# Patient Record
Sex: Female | Born: 1980 | Race: Black or African American | Hispanic: No | Marital: Single | Smoking: Never smoker
Health system: Southern US, Community
[De-identification: ages and names within clinical notes are randomized; demographics above are authoritative.]

## PROBLEM LIST (undated history)

## (undated) DIAGNOSIS — J45909 Unspecified asthma, uncomplicated: Secondary | ICD-10-CM

## (undated) HISTORY — PX: WISDOM TOOTH EXTRACTION: SHX21

---
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
## 2001-09-25 ENCOUNTER — Encounter: Payer: Self-pay | Admitting: Emergency Medicine

## 2001-09-25 ENCOUNTER — Emergency Department (HOSPITAL_COMMUNITY): Admission: EM | Admit: 2001-09-25 | Discharge: 2001-09-25 | Payer: Self-pay | Admitting: Emergency Medicine

## 2004-09-09 ENCOUNTER — Emergency Department (HOSPITAL_COMMUNITY): Admission: EM | Admit: 2004-09-09 | Discharge: 2004-09-09 | Payer: Self-pay | Admitting: Emergency Medicine

## 2004-09-20 ENCOUNTER — Emergency Department (HOSPITAL_COMMUNITY): Admission: EM | Admit: 2004-09-20 | Discharge: 2004-09-20 | Payer: Self-pay | Admitting: Emergency Medicine

## 2005-05-05 ENCOUNTER — Emergency Department (HOSPITAL_COMMUNITY): Admission: EM | Admit: 2005-05-05 | Discharge: 2005-05-05 | Payer: Self-pay | Admitting: Emergency Medicine

## 2005-06-01 ENCOUNTER — Emergency Department (HOSPITAL_COMMUNITY): Admission: EM | Admit: 2005-06-01 | Discharge: 2005-06-01 | Payer: Self-pay | Admitting: Emergency Medicine

## 2005-09-16 ENCOUNTER — Ambulatory Visit: Payer: Self-pay | Admitting: Pulmonary Disease

## 2006-01-06 ENCOUNTER — Ambulatory Visit: Payer: Self-pay | Admitting: Pulmonary Disease

## 2006-02-10 ENCOUNTER — Ambulatory Visit: Payer: Self-pay | Admitting: Pulmonary Disease

## 2006-03-10 ENCOUNTER — Ambulatory Visit: Payer: Self-pay | Admitting: Internal Medicine

## 2006-04-18 ENCOUNTER — Emergency Department (HOSPITAL_COMMUNITY): Admission: EM | Admit: 2006-04-18 | Discharge: 2006-04-18 | Payer: Self-pay | Admitting: Emergency Medicine

## 2006-05-04 ENCOUNTER — Inpatient Hospital Stay (HOSPITAL_COMMUNITY): Admission: EM | Admit: 2006-05-04 | Discharge: 2006-05-06 | Payer: Self-pay | Admitting: Emergency Medicine

## 2006-05-04 ENCOUNTER — Ambulatory Visit: Payer: Self-pay | Admitting: Internal Medicine

## 2006-06-19 ENCOUNTER — Emergency Department (HOSPITAL_COMMUNITY): Admission: EM | Admit: 2006-06-19 | Discharge: 2006-06-19 | Payer: Self-pay | Admitting: Emergency Medicine

## 2006-09-08 ENCOUNTER — Ambulatory Visit: Payer: Self-pay | Admitting: Pulmonary Disease

## 2006-10-28 ENCOUNTER — Ambulatory Visit: Payer: Self-pay | Admitting: Internal Medicine

## 2007-01-08 ENCOUNTER — Emergency Department (HOSPITAL_COMMUNITY): Admission: EM | Admit: 2007-01-08 | Discharge: 2007-01-08 | Payer: Self-pay | Admitting: Emergency Medicine

## 2007-01-09 ENCOUNTER — Emergency Department (HOSPITAL_COMMUNITY): Admission: EM | Admit: 2007-01-09 | Discharge: 2007-01-09 | Payer: Self-pay | Admitting: Emergency Medicine

## 2007-02-20 IMAGING — CR DG CHEST 1V PORT
1 series · 1 of 1 positions shown · non-contrast
Comparison: none

CLINICAL DATA: 24-year-old female with asthma.  
 PORTABLE CHEST ? 1 VIEW:

[view not recorded]
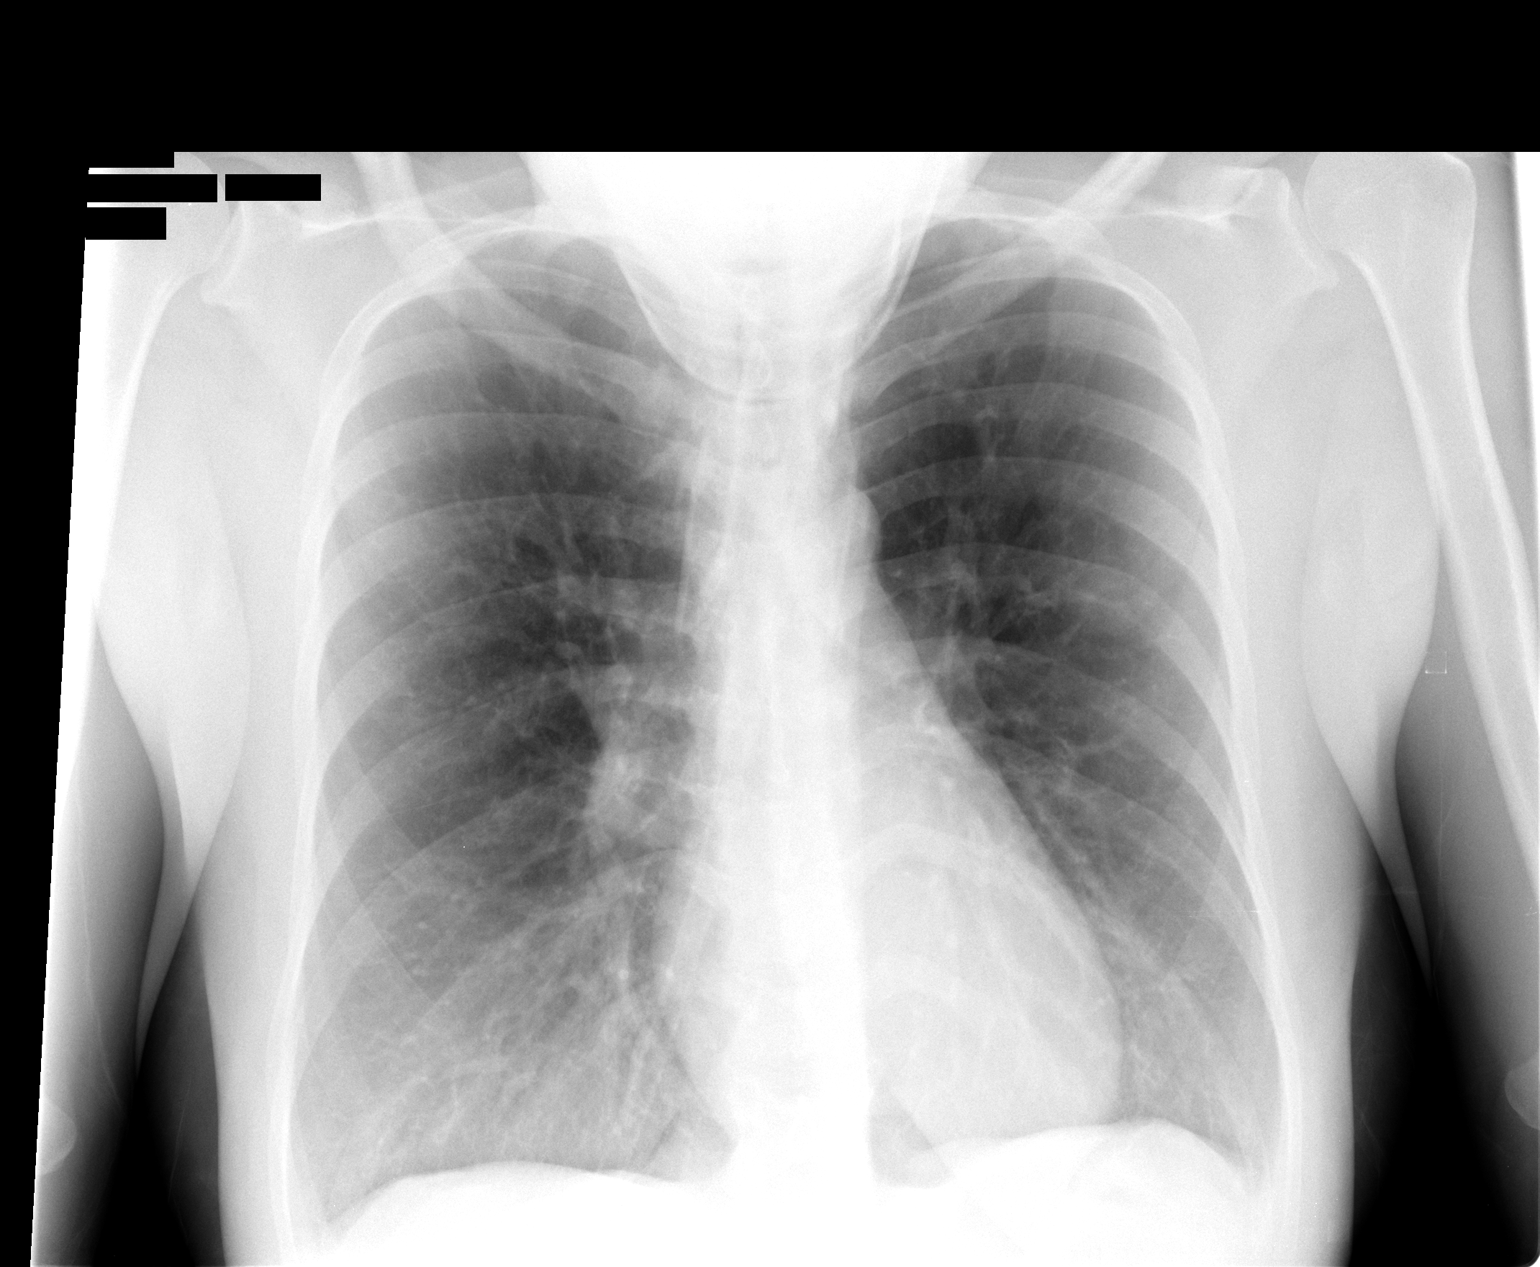

[1 of 1 positions shown; findings below may reference images not displayed]

FINDINGS: The lungs are hyperinflated.  No focal airspace disease is evident.  There is mild increased prominence of interstitial markings.  Visualized soft tissues and bony thorax are unremarkable.
IMPRESSION: 1.  Hyperinflation compatible with reactive airways disease.  
 2.  Mild interstitial prominence without focal airspace disease.

## 2007-04-17 ENCOUNTER — Ambulatory Visit: Payer: Self-pay | Admitting: Pulmonary Disease

## 2007-06-27 ENCOUNTER — Ambulatory Visit: Payer: Self-pay | Admitting: Internal Medicine

## 2007-11-10 ENCOUNTER — Encounter: Payer: Self-pay | Admitting: Pulmonary Disease

## 2008-05-06 ENCOUNTER — Inpatient Hospital Stay (HOSPITAL_COMMUNITY): Admission: EM | Admit: 2008-05-06 | Discharge: 2008-05-07 | Payer: Self-pay | Admitting: Emergency Medicine

## 2008-09-16 ENCOUNTER — Emergency Department (HOSPITAL_COMMUNITY): Admission: EM | Admit: 2008-09-16 | Discharge: 2008-09-17 | Payer: Self-pay | Admitting: Emergency Medicine

## 2008-10-03 ENCOUNTER — Emergency Department (HOSPITAL_COMMUNITY): Admission: EM | Admit: 2008-10-03 | Discharge: 2008-10-03 | Payer: Self-pay | Admitting: Emergency Medicine

## 2009-02-22 IMAGING — CR DG CHEST 2V
2 series · 2 of 2 positions shown · non-contrast
Comparison: 05/04/2006

CLINICAL DATA: Short of breath.  Asthma.

CHEST - 2 VIEW

[w chest pa *]
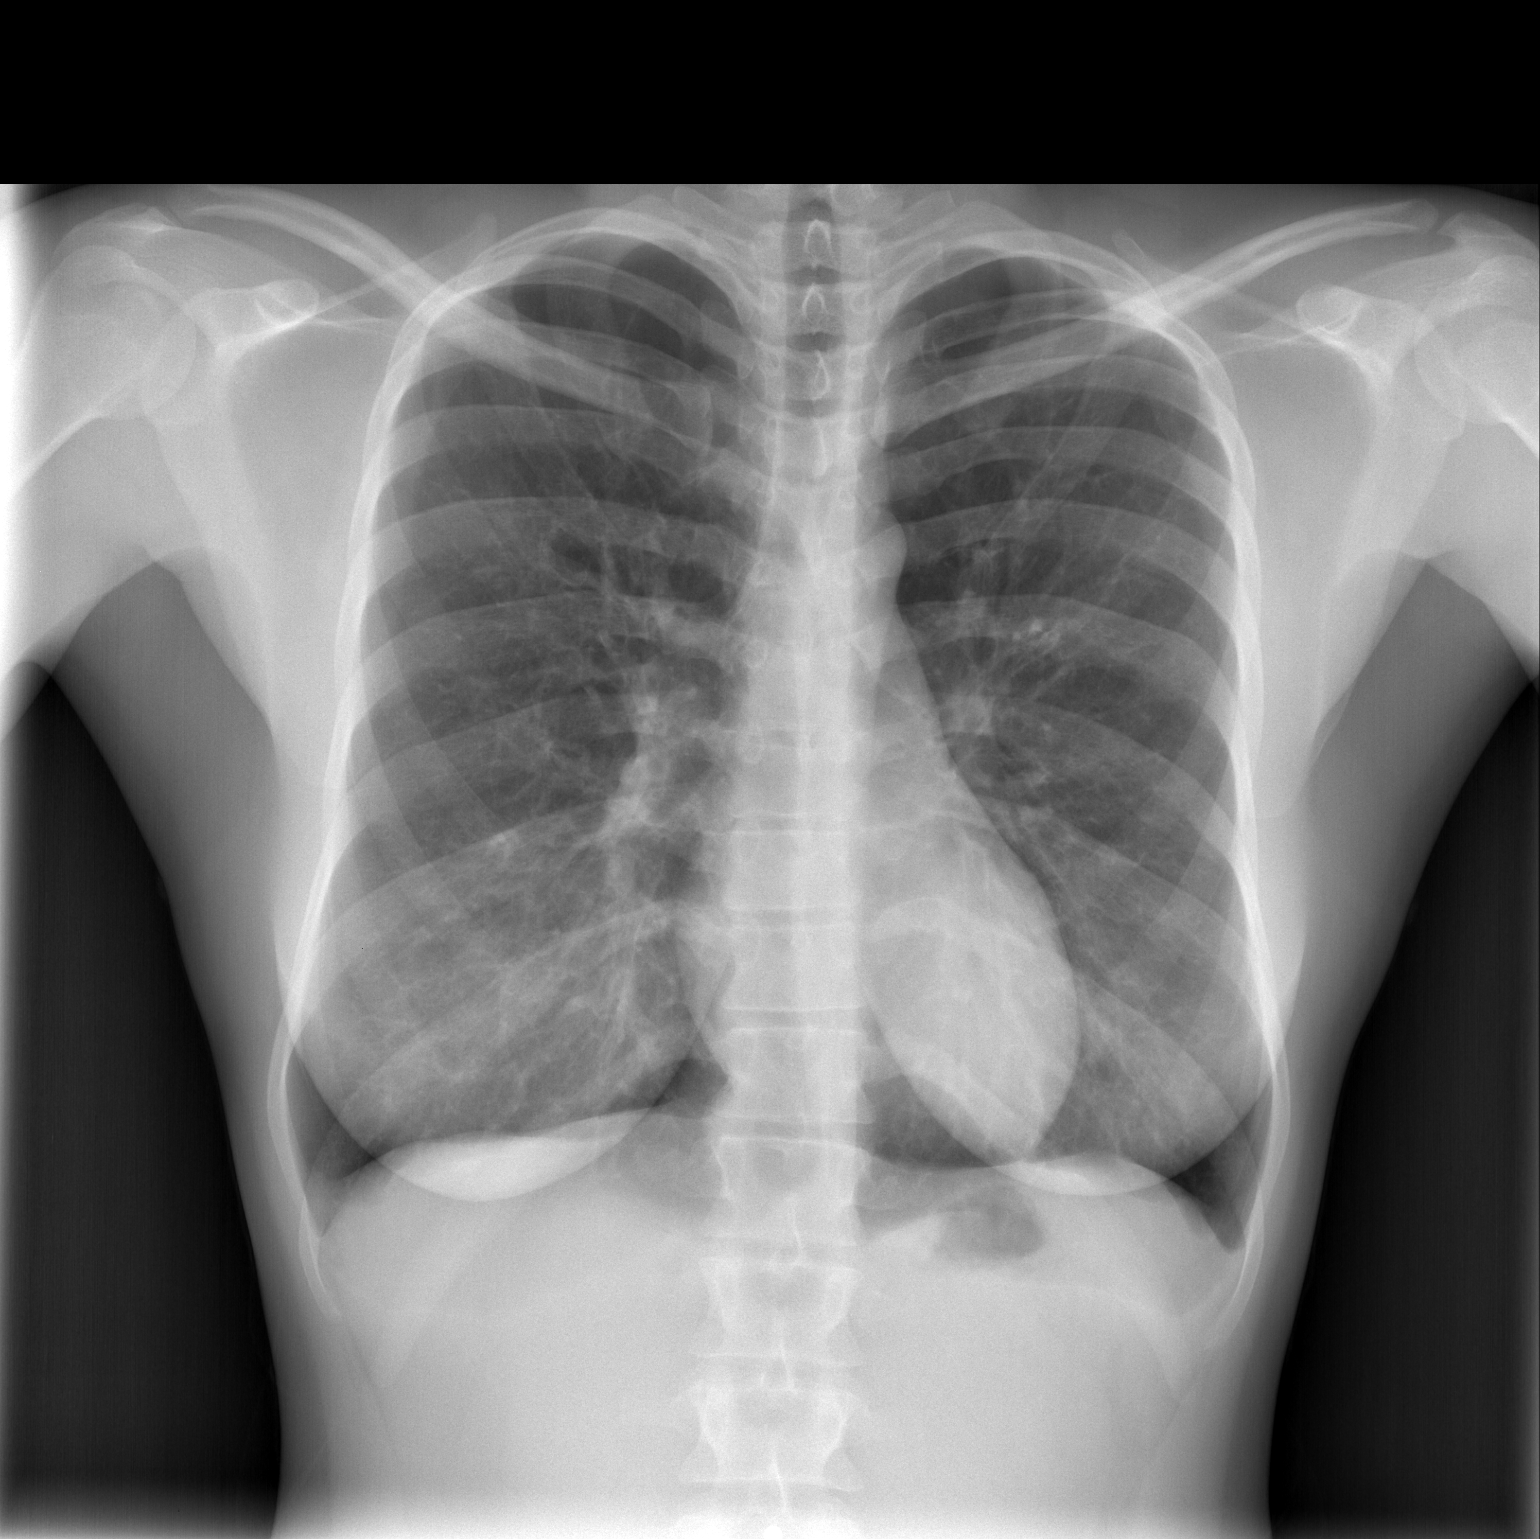

[w chest lat *]
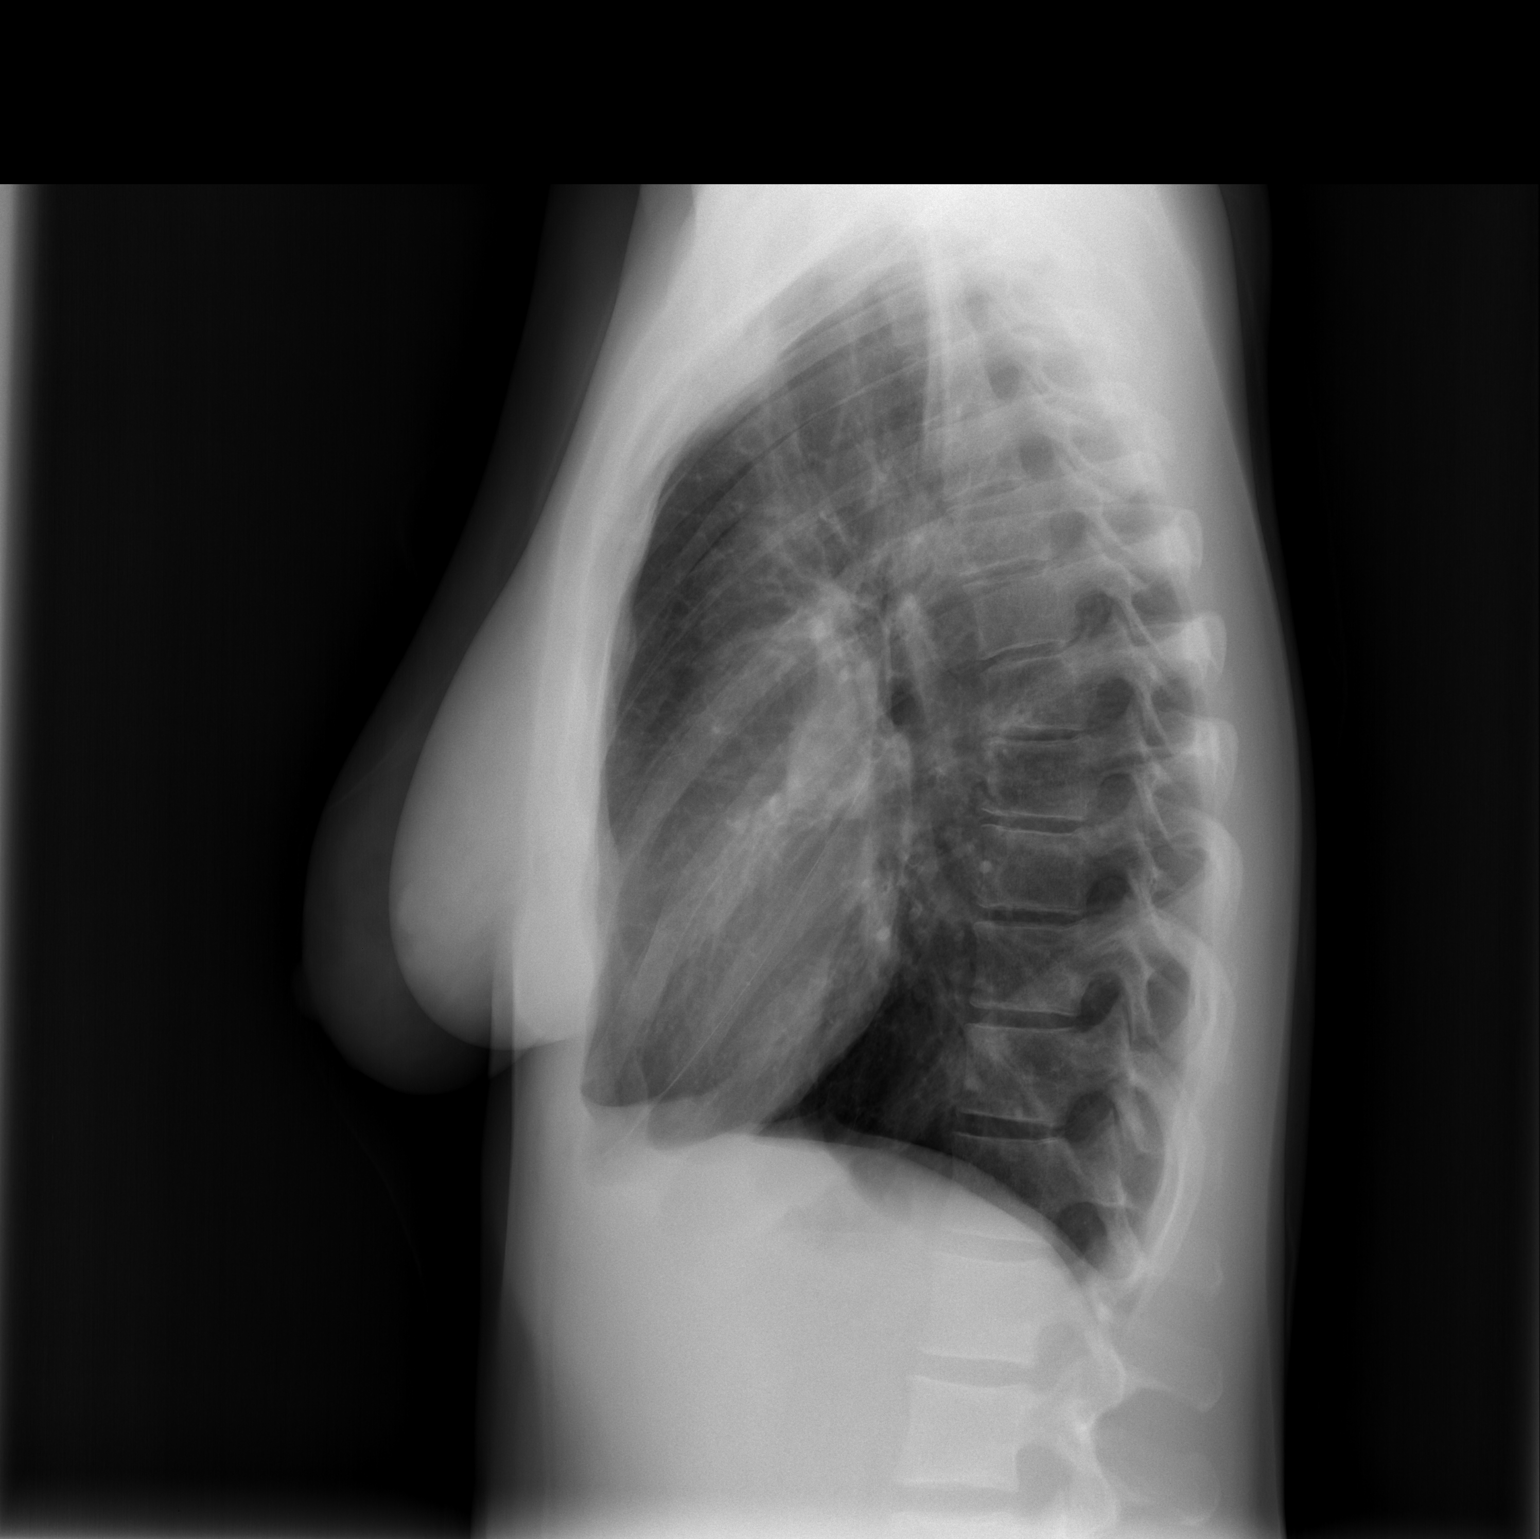

[2 of 2 positions shown; findings below may reference images not displayed]

FINDINGS: Lung volume is normal.  Lung markings are mildly
prominent compatible with asthma.  There is no acute infiltrate or
effusion.  The lungs are clear.
IMPRESSION: Chronic lung disease.  No acute abnormality.

## 2011-05-04 NOTE — Assessment & Plan Note (Signed)
[facility]                             PULMONARY OFFICE NOTE   [name], [name]                   MRN:          [id number]  DATE:06/27/2007                            DOB:          20-Jun-1981    HISTORY OF PRESENT ILLNESS:  The patient is a 30 year old African-  American female patient of Dr. Sung Amabile who has a history of moderate  persistent asthma who presents today for an acute office visit. The  patient complains that over the last week she has had increased dry  coughs, wheezing, and shortness of breath. The patient has ran out of  her Symbicort shortly after symptoms began. The patient denies any  fever, hemoptysis, orthopnea, PND, or leg swelling. The patient is  supposed to be maintained on Symbicort 160/4.5 2 puffs twice daily and  Singulair 10 mg daily.   PAST MEDICAL HISTORY:  Reviewed.   CURRENT MEDICATIONS:  Reviewed.   PHYSICAL EXAMINATION:  GENERAL:  The patient is a pleasant female in no  acute distress.  VITAL SIGNS:  She is afebrile with stable vital signs. O2 saturation is  96% on room air.  HEENT:  Unremarkable.  NECK:  Supple without cervical adenopathy. No JVD.  LUNGS:  Reveal expiratory wheezes bilaterally.  CARDIAC:  Regular rate.  ABDOMEN:  Soft and nontender.  EXTREMITIES:  Warm without calf tenderness, cyanosis, clubbing, or  edema.   IMPRESSION AND PLAN:  Acute asthmatic exacerbation secondary to  discontinuation of medications. The patient is recommended to restart  Symbicort 160/4.5 2 puffs twice daily and Singulair daily. The patient  was given a Xopenex nebulizer treatment in the office with improved  aeration and will begin a short prednisone taper over the next week. She  will follow back up here in the office in 1-2 weeks with Dr. Sung Amabile or  sooner if needed. The patient is also to reschedule her appointment with  our allergist, Dr. Maple Hudson, as recommended. The patient had previously  missed an  appointment with him last month.      Rubye Oaks, NP  Electronically Signed      Oley Balm. Sung Amabile, MD  Electronically Signed   TP/MedQ  DD: 06/30/2007  DT: 07/01/2007  Job #: [id number]

## 2011-05-04 NOTE — H&P (Signed)
[name], [name]          ACCOUNT NO.:  [id number]   MEDICAL RECORD NO.:  [id number]          PATIENT TYPE:  EMS   LOCATION:  ED                           FACILITY[facility]  [facility]   PHYSICIAN:  Michelene Gardener, MD    DATE OF BIRTH:  1981/09/18   DATE OF ADMISSION:  05/06/2008  DATE OF DISCHARGE:                              HISTORY & PHYSICAL   PRIMARY CARE PHYSICIAN:  The pt normally follows at [facility]   CHIEF COMPLAINT:  Increasing shortness of breath and wheezes.   HISTORY OF PRESENT ILLNESS:  This is a 30 year old African American  female with past medical history of asthma presented with the above-  mentioned complaint.  This patient had allergies to egg and does not  take eggs.  She went to [employer] last night and she was given egg by  mistake in her sandwich.  Since that time, she has been having  increasing tightness with wheezes and shortness of breath.  She has also  been having dry cough.  She used her albuterol and Symbicort at home  without much relief.  So she came to the hospital for further  evaluation.  In the hospital, she was given a couple of nebulizer  treatments and she was given Solu-Medrol.  She is still very tight and  wheezy, and the hospitalist service was called for further evaluation.   PAST MEDICAL HISTORY:  Significant for asthma.   PAST SURGICAL HISTORY:  Denied.   ALLERGIES:  BENADRYL, PENICILLIN, EGG.   CURRENT MEDICATIONS:  1. Symbicort two puffs twice daily.  2. Albuterol 2 puffs q.4 h as needed.   SOCIAL HISTORY:  She denies smoking.  She drinks alcohol occasionally.  She denied recreational drugs.   FAMILY HISTORY:  Her mother had history of asthma, diabetes run in both  sides of the family.   REVIEW OF SYSTEMS:  CONSTITUTIONAL:  Positive for fatigability.  Eyes:  No blurred vision.  ENT:  No tinnitus.  RESPIRATORY:  Positive for dry  cough, wheeze and shortness of breath.  GI:  No nausea, vomiting or  diarrhea.   GU:  No dysuria or hematuria.  ENDOCRINE:  No polyuria, no  nocturia, no bruises, no bleeding.  ID:  No rash, no lesions.  NEUROLOGICAL:  No numbness or tingling.  The rest of systems reviewed  and are negative.   PHYSICAL EXAMINATION:  VITAL SIGNS: Temperature is 98.4.  Blood pressure  initially was 99/71 and repeat was that 129/64, pulse oximetry 78,  respiratory rate 22.  GENERAL APPEARANCE:  This is a young [place] American female in no acute  distress.  HEENT:  Conjunctivae are pink.  Pupils are equal and react to light.  There is no ptosis.  Hearing is intact.  There is no ear discharge or  infection.  There is no oral infection or bleeding.  Oral mucosa is dry.  Oropharyngeal no erythema.  NECK:  Supple.  No JVD, no carotid bruit, no lymphadenopathy.  No  thyroid enlargement or thyroid tenderness.  CARDIOVASCULAR:  S1, S2 regular.  There are no murmurs, gallops noticed.  RESPIRATORY EXAMINATION:  Breathing between 20-22.  There is no use of  accessory muscles.  There is positive bilateral expiratory wheezes.  There are no rales.  There is no rhonchi.  ABDOMEN:  Soft, nondistended, no tenderness.  No hepatosplenomegaly.  Bowel sounds are present.  LOWER EXTREMITIES:  No edema.  No rash.  No varicose veins.  NEUROLOGICAL:  Cranial nerves II-XII are intact.  There is no motor or  sensory deficits.   LABORATORY DATA:  Sodium 140, potassium 4.7, chloride 107, bicarb 23 and  BUN 8, creatinine 1.1.   STUDIES:  Chest x-ray showing showed chronic lung disease without acute  abnormalities.   IMPRESSION:  1. Acute exacerbation of asthma.  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  3. Polycythemia.   PLAN:  This patient has history of asthma.  She has never been  intubated.  She does not use home oxygen and she is not on steroids.  Her asthma exacerbated because she took egg which she is allergic to.  She improved some to nebulizer treatment, but she is still very tight  and has a lot of  wheezes bilaterally.  I will admit her to the hospital  for 24-hour observation.  I will start her on Solu-Medrol 80 mg IV q.8  h.  I will put her on nebulizer treatment q.6 h and then q.2 h as  needed.  Currently, saturating well, but we will watch her oxygenation  very carefully and if she starts to desaturate, then will start her on  oxygen.  No need for antibiotics at this time.   ASSESSMENT TIME:  Fifty minutes.      Michelene Gardener, MD  Electronically Signed     NAE/MEDQ  D:  05/06/2008  T:  05/06/2008  Job:  [id number]

## 2011-05-07 NOTE — Letter (Signed)
July 21, 2006     [facility]  [address]  [place], [place] [address]   RE:  [name], [name]  MRN:  [id number]  /  DOB:  09-06-1981   Dear Ms. Bielby:   My records indicate that you have failed to return for follow-up office  visit as requested on March 10, 2006, and were seen in the emergency room  unofficially for an asthma attack on June 19, 2006, and advised that you  would need to reestablish with my office.  My understanding is that you have  now made several appointments with Dr. Jayme Cloud but were not able to keep  them.  I was advised today that you do, indeed, have an appointment to see  Dr. Jayme Cloud within the next three weeks.  If you are not able to make this  appointment, I would like you to call my office and explain why.  Otherwise,  if you fail to keep this appointment, I will be forced to discharge you from  our medical practice to release [place] from responsibility for your care.   I want to take this opportunity to remind you that I advised both you and  your mother in the ER that you would need much more intense outpatient  follow-up by our office to prevent you from further complications of your  asthma including death.  I hoped that you would take this issues more  seriously than you have to date and this later will serve as my final  warning that we will be forced to  discharge you from our practice if you do not keep your appointments in this  clinic with one of our pulmonary specialists within the next three weeks.   Sincerely,     Casimiro Needle B. Sherene Sires, MD, [facility]   MBW/MedQ  DD:  07/21/2006  DT:  07/21/2006  Job #:  [id number]

## 2011-05-07 NOTE — Assessment & Plan Note (Signed)
[facility]                               PULMONARY OFFICE NOTE   SHAMAINE, MULKERN                   MRN:          [id number]  DATE:09/08/2006                            DOB:          Sep 18, 1981    Ms. Peyser is a 30 year old African-American female who previously saw  Dr. Wandra Scot and had issues with moderate persistent asthma and poor  medical compliance. Subsequently she was transferred to the care of Dr.  Sandrea Hughs, and he saw her in March of 2007. At that time, it was known  that the patient had difficulties with difficult to control asthma, most  likely due to chronic remodeling due to the patients non-adherence to  medical regimen. The patient subsequently apparently stated that she wanted  to switch her care to me, and apparently this was done without my approval  of the same.   Today, she presents with what apparently was a followup visit but really is  more of an acute care extended visit given the fact that she is having  active symptoms. The patient states that she has had difficulties with  increasing dyspnea over the last 3-4 days prior to this visit.  She has had  some nasal congestion with clear nasal discharge.  No fevers, chills or  sweats.   CURRENT MEDICATIONS:  She claims she is on Singulair 10 mg daily and Advair  250/51 inhalation twice a day.  In addition, she uses albuterol as a rescue  inhaler. However, she appears to be out of the majority of these  medications, she states due to insurance issues.   PHYSICAL EXAM:  VITAL SIGNS: Blood pressure 100/70, pulse 80, oxygen  saturation of 97%, temperature of 98 and a weight of 143 pounds.  GENERAL: This is a well-developed, well-nourished African-American female  who appears to be in no acute respiratory distress.  HEENT: Reveals turbinate edema with clear discharge from the nares.  She has  moderate postnasal drip noted. Pharynx is otherwise clear.  Ears  show  bilateral serous otitis.  NECK: Supple.  No adenopathy noted.  No JVD.  LUNGS: She has scattered wheezes throughout, but air movement remains  relatively intact.  CARDIAC: Regular rate and rhythm.  No rubs, murmurs or gallops heard.  EXTREMITIES: The patient has no cyanosis, no clubbing, no edema noted.   IMPRESSION:  Acute exacerbation of asthma likely triggered by allergic  rhinitis and possibly also due to medical non-adherence.   PLAN:  1. We did give the patient samples of Singular 10 mg, provided her also      with a prescription for the same.  2. We will switch her from Advair to Symbicort 164.5, two inhalations      twice a day. Patient was instructed proper way of using this.  3. Patient will also adhere to a nasal hygiene with Nasonex, two      inhalations daily to each nostril and nasal saline.  4. We will obtain an IgM RAST today.  5. Followup will be with our nurse practitioner in two weeks' time.  She  is contact us prior to that time should any new problems arise. I will      see her one more time after that; however,      she has been informed if she continues to have difficulties with non-      adherence and lack of followup, she will be discharged from the      practice.                                   Gailen Shelter, MD   CLG/MedQ  DD:  09/09/2006  DT:  09/12/2006  Job #:  [id number]   cc:   Charlaine Dalton. Sherene Sires, MD, FCCP

## 2011-05-07 NOTE — Discharge Summary (Signed)
[name], [name]          ACCOUNT NO.:  [id number]   MEDICAL RECORD NO.:  [id number]          PATIENT TYPE:  INP   LOCATION:  4729                         FACILITY:  [facility]   PHYSICIAN:  Casimiro Needle B. Sherene Sires, M.D. [facility] OF BIRTH:  [birth year]   DATE OF ADMISSION:  05/04/2006  DATE OF DISCHARGE:  05/06/2006                                 DISCHARGE SUMMARY   FINAL DIAGNOSES:  1.  Status asthmaticus with acute respiratory distress and hypoxemia,      resolved.  2.  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   HISTORY:  This is a 30 year old black female who has seen both Dr. Sung Amabile  and myself with emphasis on each visit to maintaining controlling  medications and minimizing the use of albuterol.  She was supposed to be  maintained on Advair 250/50 b.i.d. and Singulair 10 mg daily and states she  had no symptoms while on it but within a week of running out of her Advair  (not ever clear exactly why she stopped it other than she ran out), she  began having trouble with her allergies, which consisted of nasal and  chest congestion with a hacking cough productive of minimal, thick mucus  and increasing need for albuterol, both in MDI and nebulizer form.  She came  to the emergency room in respiratory extremis with hypercarbic and hypoxemic  respiratory failure with threatened intubation.  She responded, however, to  magnesium sulfate, IV steroids, and albuterol in the emergency room and was  admitted, ultimately, to the floor.  She gradually improved to the point  where she did not need any form of rescue therapy for 12 hours prior to  discharge.  At the time of discharge, she did have a few end-expiratory  wheezes but was comfortable at rest on room air.   Each day I saw this patient, I spent extra time trying to trouble-shoot the  hurdles that are preventing her from getting adequate care.  I have  offered free samples and the  services of our nurse practitioner for work-in  visits, in the event that she either runs out of her medicines or begins  noticing increased need for albuterol over her baseline (which is typically  less than twice weekly).  I told her that anytime she needs to use her  nebulizer as a back-up, it means she needs to pick up the phone and call  us and if the nebulizer does not completely eradicate all of her symptoms,  she needs to go to the emergency room immediately and emphasized to her that  she cannot get to the root of the problem by simply covering up the  symptoms with albuterol.   At the time of discharge, she was improved.  I was still not sure after  three days that she got the message, in terms of the seriousness of her  illness, but I did tell her that she was at a significant risk of asthma  morbidity, if not asthma mortality if she did not follow the instructions  and would be happy  to refer her to another physician if she would like, but  if she wants to continue with our pulmonary division treating her, she will  need to keep appointments and keep up with her medicines.   At the time of discharge, she is on Advair 250/50 1 b.i.d., Singulair 10 mg  1 p.o. q.p.m., Singulair 10 mg tablets 4 b.i.d. x3 days, then 4 q.a.m. x3  days, then 3 q.a.m. x3 days, then 2 q.a.m. x3 days, then 1 q.a.m. x3 days.  We will see her after five days.  For back pain, she can use ibuprofen 800  mg t.i.d. with followup by [facility] and for wheezing/dyspnea, she can use albuterol 2 puffs every 4  hours with nebulizer.  Will reserve for emergency purposes only.  Her diet  and activity are to be regular at the time of discharge with followup within  the next five days with all of her medicines in hand.           ______________________________  Charlaine Dalton. Sherene Sires, M.D. [facility]     MBW/MEDQ  D:  05/06/2006  T:  05/06/2006  Job:  [id number]

## 2011-05-07 NOTE — Discharge Summary (Signed)
Graham, Christina          ACCOUNT NO.:  [id number]   MEDICAL RECORD NO.:  [id number]          PATIENT TYPE:  INP   LOCATION:  1511                         FACILITY:  [facility]   PHYSICIAN:  Michelene Gardener, MD    DATE OF BIRTH:  April 07, 1981   DATE OF ADMISSION:  05/06/2008  DATE OF DISCHARGE:  05/07/2008                               DISCHARGE SUMMARY   PRIMARY CARE PHYSICIAN:  [facility].   DISCHARGE DIAGNOSIS:  1. Acute exacerbation of asthma.  2. ALLERGIC REACTION TO EGG.  3. Polycythemia.   DISCHARGE MEDICATIONS:  1. Symbicort inhaler as needed.  2. Albuterol inhaler as needed.  3. Xopenex as needed.  4. Prednisone tapering dose.   CONSULTATIONS:  None.   PROCEDURES:  None.   FOLLOW-UP APPOINTMENT:  With primary doctor in 1-2 weeks.   DIAGNOSTIC STUDIES:  Chest x-ray on May 06, 2008 showed chronic lung  disease without evidence of acute problem.   COURSE OF HOSPITALIZATION:  This is a 30 year old African American  female who presented to the hospital with increasing shortness of breath  and wheezes after she ate an egg.  This patient has a known EGG ALLERGY  and apparently she developed allergies to egg that exacerbated her  underlying asthma.  The patient was admitted to the hospital.  She was  started on IV Solu-Medrol.  She was given Benadryl.  She was put on  breathing treatment.  Initially she was put on oxygen and then the  oxygen was taken off.  The patient responded very quickly.  At the time  of discharge she was off the oxygen and she was saturating very well on  room air.  Her Solu-Medrol was switched  to p.o. prednisone.  She was advised to continue on her inhalers as  prescribed.  She was advised to avoid eggs.  Otherwise her medical  condition remained stable in the hospital.   ASSESSMENT TIME:  40 minutes.      Michelene Gardener, MD  Electronically Signed     NAE/MEDQ  D:  06/14/2008  T:  06/14/2008  Job:  [id number]   cc:    [facility]

## 2011-05-07 NOTE — Letter (Signed)
August 07, 2007    Ms. Darian Bee  [address], [address]  [place], [place] [address]   RE:  IVADELL, GAUL  MRN:  [id number]  /  DOB:  [birth year]   Dear Ms. Ellis:   I am sorry to inform you that this is a letter of dismissal from the  [facility]. You were scheduled for a  followup appointment on July 24, 2007 and failed to show for that  appointment. Upon review of your chart, there has been a pattern of  repeated no-shows. More importantly, there has been a pattern of medical  non-compliance with your controller regimen for asthma. This is despite  the fact that you have been counseled in detail on multiple occasions  about the need for maintenance therapy for your asthma and about the  potential life threatening nature of asthma, when poorly managed.   Under these circumstances, I do not believe that we can continue to be  responsible for your medical care and for the management of your asthma.  The [facility] policy is that we will provide any emergency  care necessary for the next 30 days, as you seek another physician for  asthma management. If you have any questions or concerns related to  this, please contact us and I will try to have these questions answered.    Sincerely,      Oley Balm. Sung Amabile, MD  Electronically Signed    DBS/MedQ  DD: 08/07/2007  DT: 08/07/2007  Job #: [id number]

## 2011-05-07 NOTE — H&P (Signed)
[facility]  Patient:    [name], [name]                   MRN: [id number] Adm. Date:  [id number] Attending:  Armanda Heritage                         History and Physical  DATE OF BIRTH:  [birth year].  PROBLEM LIST  1. Acute asthma exacerbation, rule out pneumonia.  2. Right lower lobe granuloma.  3. History of asthma since childhood.     a. Increased frequency of asthma exacerbations in the year ______ with        five emergency room visits.     b. Poor adherence to medications.  4. ? Allergy to penicillin.  CHIEF COMPLAINT:  Shortness of breath.  HISTORY OF PRESENT ILLNESS:  Ms. Teaney is an 30 year old female who presents with complaints of shortness of breath and dry cough since last night.  The patient described how she was at her usual health until yesterday evening, when she developed progressive cough, shortness of breath and wheezing.  The patient denies GERD, postnasal drip, smoking.  She did not have any pets at home.  They change the air filter frequently in the apartment where she lives with her mom.  No fever, chills, nausea, vomiting, diarrhea, constipation, skin rash, headache.  No urinary symptoms.  No hemoptysis.  No hematemesis.  No swallowing problems.  No focal weakness.  No GI complaints. No vaginal discharge.  The patient used to be on Flovent, prior to 2001, with fair control of her asthma symptoms.  The patient describes at least once-a-week exacerbation symptoms that normally are treated with albuterol MDI or nebulizer treatments.  PAST MEDICAL HISTORY:  As problem list.  ALLERGIES:  As problem list.  MEDICATIONS  1. Albuterol MDI two puffs p.r.n.  2. Albuterol nebulizer treatment p.r.n.  SOCIAL HISTORY:  The patient is single.  No children.  No alcohol nor illegal drug use.  No smoking.  She works part-time in [employer].  FAMILY MEDICAL HISTORY:  Significant for diabetes (grandmother), lung  cancer (great-grandfather).  No hypertension, strokes or coronary artery disease in the family.  REVIEW OF SYSTEMS:  As HPI.  PHYSICAL EXAMINATION  VITAL SIGNS:  Temperature 97.3, heart rate 116, blood pressure 120/70, respirations 16, oxygen saturation 93% on 2 L.  HEENT:  Normocephalic, nontraumatic.  Nonicteric sclerae.  Conjunctivae within the normal limits.  PERRLA.  EOMI.  Funduscopic exam negative for papilledema or hemorrhages.  TMs within the normal limits.  Oropharynx clear.  Slight dry mucous membranes.  NECK:  Supple.  No JVD.  No bruits.  No adenopathy.  No thyromegaly.  LUNGS:  Inspiratory and expiratory wheezes bilaterally in all fields.  No crackles.  No rales.  Poor air movement bilaterally.  CARDIAC:  Tachycardia, regular rhythm.  No murmurs, rubs, gallops.  Normal S1 and S2.  ABDOMEN:  Nontender, nondistended.  Bowel sounds were present.  No hepatosplenomegaly.  No rebound.  No guarding.  No bruits.  No masses.  GU:  Within the normal limits.  BREASTS:  Within the normal limits.  RECTAL:  Not done.  EXTREMITIES:  No edema, clubbing or cyanosis.  Pulses 2+ bilaterally.  NEUROLOGIC:  Alert and oriented x 3.  Strength 5/5 in all extremities.  DTRs 3/5 in all extremities.  Cranial nerves II-XII intact.  Sensory intact. Plantar reflexes downgoing bilaterally.  LABORATORY AND X-RAY FINDINGS:  Lab data pending.  Chest x-ray consistent with hyperaeration with a new right lower lobe granuloma.  ASSESSMENT AND PLAN  1. Acute asthma exacerbation:  At this point, there is no evidence of an     infectious process despite the new finding of the right lower lobe     granuloma.  I believe that the acute asthma exacerbation is likely to be     related to the weather changes as well as the pollen exposure.  I do not     think that this patient has been exposed to any allergin at work.     Obviously, she was not receiving standard care of therapy for her asthma.      The patient was only using albuterol despite her frequent signs of     bronchospasm.  Ms. Henrie has not been exposed to smoking.  She denies     symptoms of gastroesophageal reflux disease, postnasal drip.  She does not     have positive end-expiratory pressure at home.  The air filters are being     changed frequently as recommended in the past.  We will go ahead and admit     the patient to a regular bed.  Intravenous steroid therapy will be started     every six hours.  Bronchodilator treatment every six hours also will be     started today.  Flovent and Singulair will be added to help with the     airway inflammation.  Intravenous fluids also will be provided for     supportive care.  We will obtain a complete blood count with a     differential to evaluate the eosinophil count.  Followup chest x-ray will     be obtained in the morning.  As it is for now, an empiric antibiotic     therapy is not needed, though we will be monitoring Ms. Needham for     signs of infectious process.  2. Right lower lobe granuloma:  The patient denies any night sweats or weight     loss.  There is no evidence of systemic symptoms of malaise, fever,     chills, synovitis, hemoptysis, ascites, central nervous system symptoms.     A purified protein derivative could be considered.  A followup chest x-ray     will be repeated tomorrow morning and as described previously, a complete     blood count with differential will be obtained.  At this point, I would     not suspect pulmonary aspergillosis.  The patient moved from [place],     close to the [facility], about five years ago.  An exposure to     ______ agents is possible, though currently seems to be innocuous.  This     problem should be followed as an outpatient with serial chest x-rays over     the next three to six months. DD:  10/27/00 TD:  10/27/00 Job: [id number]

## 2011-09-15 LAB — CBC
Hemoglobin: 15.2 — ABNORMAL HIGH
Platelets: 366
RDW: 13.7

## 2011-09-15 LAB — DIFFERENTIAL
Basophils Absolute: 0.3 — ABNORMAL HIGH
Lymphocytes Relative: 5 — ABNORMAL LOW
Monocytes Relative: 1 — ABNORMAL LOW
Neutrophils Relative %: 92 — ABNORMAL HIGH

## 2011-09-15 LAB — POCT PREGNANCY, URINE
Operator id: 264031
Preg Test, Ur: NEGATIVE
Preg Test, Ur: NEGATIVE

## 2011-09-15 LAB — POCT I-STAT, CHEM 8
Chloride: 107
HCT: 50 — ABNORMAL HIGH
Potassium: 4.7

## 2011-09-15 LAB — URINALYSIS, ROUTINE W REFLEX MICROSCOPIC
Hgb urine dipstick: NEGATIVE
Nitrite: NEGATIVE
Specific Gravity, Urine: 1.01
Urobilinogen, UA: 0.2

## 2013-11-05 ENCOUNTER — Encounter (HOSPITAL_COMMUNITY): Payer: Self-pay | Admitting: Emergency Medicine

## 2013-11-05 ENCOUNTER — Emergency Department (HOSPITAL_COMMUNITY)
Admission: EM | Admit: 2013-11-05 | Discharge: 2013-11-05 | Disposition: A | Discharge status: Home / Self Care | Payer: BC Managed Care – PPO | Attending: Emergency Medicine | Admitting: Emergency Medicine

## 2013-11-05 DIAGNOSIS — H109 Unspecified conjunctivitis: Secondary | ICD-10-CM | POA: Insufficient documentation

## 2013-11-05 DIAGNOSIS — J029 Acute pharyngitis, unspecified: Secondary | ICD-10-CM | POA: Insufficient documentation

## 2013-11-05 DIAGNOSIS — S0501XA Injury of conjunctiva and corneal abrasion without foreign body, right eye, initial encounter: Secondary | ICD-10-CM

## 2013-11-05 DIAGNOSIS — X58XXXA Exposure to other specified factors, initial encounter: Secondary | ICD-10-CM | POA: Insufficient documentation

## 2013-11-05 DIAGNOSIS — Y939 Activity, unspecified: Secondary | ICD-10-CM | POA: Insufficient documentation

## 2013-11-05 DIAGNOSIS — Y929 Unspecified place or not applicable: Secondary | ICD-10-CM | POA: Insufficient documentation

## 2013-11-05 DIAGNOSIS — J45909 Unspecified asthma, uncomplicated: Secondary | ICD-10-CM | POA: Insufficient documentation

## 2013-11-05 DIAGNOSIS — J069 Acute upper respiratory infection, unspecified: Secondary | ICD-10-CM | POA: Insufficient documentation

## 2013-11-05 DIAGNOSIS — H1033 Unspecified acute conjunctivitis, bilateral: Secondary | ICD-10-CM

## 2013-11-05 DIAGNOSIS — S058X9A Other injuries of unspecified eye and orbit, initial encounter: Secondary | ICD-10-CM | POA: Insufficient documentation

## 2013-11-05 HISTORY — DX: Unspecified asthma, uncomplicated: J45.909

## 2013-11-05 LAB — RAPID STREP SCREEN (MED CTR MEBANE ONLY): Streptococcus, Group A Screen (Direct): NEGATIVE

## 2013-11-05 MED ORDER — POLYMYXIN B-TRIMETHOPRIM 10000-0.1 UNIT/ML-% OP SOLN: 1.0000 [drp] | OPHTHALMIC | Status: AC

## 2013-11-05 MED ORDER — TETRACAINE HCL 0.5 % OP SOLN
1.0000 [drp] | Freq: Once | OPHTHALMIC | Status: AC
  Administered 2013-11-05: 1 [drp] via OPHTHALMIC
  Filled 2013-11-05: qty 2

## 2013-11-05 MED ORDER — FLUORESCEIN SODIUM 1 MG OP STRP
2.0000 | ORAL_STRIP | Freq: Once | OPHTHALMIC | Status: AC
  Administered 2013-11-05: 2 via OPHTHALMIC
  Filled 2013-11-05: qty 2

## 2013-11-05 NOTE — ED Notes (Signed)
Pt having discharge bilat eyes---worse in L eye. Both eyes are red and sensitive to light.

## 2013-11-05 NOTE — ED Provider Notes (Signed)
CSN: [id number]     Arrival date & time 11/05/13  4098 History   First MD Initiated Contact with Patient 11/05/13 [phone]     Chief Complaint  Patient presents with  . Eye Problem    HPI  [name] is a 32 y.o. female with a PMH of asthma who presents to the ED for evaluation of eye problem.  History was provided by the patient.  Patient states that she has been having eye irritation for the past 24 hours.  It started with her left eye and has not progressed to her right eye this morning.  She states she woke up this morning and her eyes were "crusted shut."  She states she has had yellow pus like drainage from her eyes.  She denies any pain with eye movement, vision changes, eye swelling, or foreign body.  She has been rubbing her eyes due to the irritation.  She does not wear contacts or glasses.  She has been using Visine eye drops and her roommates contact solution to "flush out her eye" with no relief.  She was using Vicks Vapor rub yesterday and believes she got some into her left eye.  She also has had rhinorrhea, congestion, and sore throat.  No fevers, neck pain, cough, SOB, chest pain, abdominal pain, nausea, emesis, diarrhea, rash, or headache.  No known sick contacts.     Past Medical History  Diagnosis Date  . Asthma    History reviewed. No pertinent past surgical history. No family history on file. History  Substance Use Topics  . Smoking status: Never Smoker   . Smokeless tobacco: Not on file  . Alcohol Use: Yes     Comment: occ   OB History   Grav Para Term Preterm Abortions TAB SAB Ect Mult Living                 Review of Systems  Constitutional: Negative for fever, chills, activity change, appetite change and fatigue.  HENT: Positive for congestion, rhinorrhea and sore throat. Negative for drooling, ear pain, mouth sores, sinus pressure, sneezing, tinnitus and voice change.   Eyes: Positive for photophobia, pain (irritation), discharge and redness. Negative  for itching and visual disturbance.  Respiratory: Negative for cough and shortness of breath.   Cardiovascular: Negative for chest pain.  Gastrointestinal: Negative for nausea, vomiting, abdominal pain and diarrhea.  Genitourinary: Negative for dysuria.  Musculoskeletal: Negative for back pain, gait problem and myalgias.  Skin: Negative for rash.  Neurological: Negative for dizziness, weakness, light-headedness and headaches.    Allergies  Benadryl; Eggs or egg-derived products; and Penicillins  Home Medications  No current outpatient prescriptions on file. BP 119/69  Pulse 89  Temp(Src) 97.4 F (36.3 C) (Oral)  Resp 18  Wt 170 lb (77.111 kg)  SpO2 100%  Filed Vitals:   11/05/13 0649 11/05/13 0902  BP: 119/69 131/75  Pulse: 89 99  Temp: 97.4 F (36.3 C) 97.6 F (36.4 C)  TempSrc: Oral Oral  Resp: 18 18  Weight: 170 lb (77.111 kg)   SpO2: 100% 99%    Physical Exam  Nursing note and vitals reviewed. Constitutional: She is oriented to person, place, and time. She appears well-developed and well-nourished. No distress.  HENT:  Head: Normocephalic and atraumatic.  Right Ear: External ear normal.  Left Ear: External ear normal.  Nose: Nose normal.  Mouth/Throat: Oropharynx is clear and moist. No oropharyngeal exudate.  TM's gray and translucent bilaterally.  Uvula midline.  No erythema  or exudates to the posterior pharynx.  No trismus.   Eyes: EOM are normal. Pupils are equal, round, and reactive to light. Right eye exhibits no discharge. Left eye exhibits no discharge.  Bulbar and palpebral conjunctiva is injected bilaterally left worse than right.  Yellow discharge present in the left medial angle of the eye.  No pain with eye movement.  No visible foreign bodies.  No palpable or visible masses to the eyelids bilaterally.  No exudates or hemorrhages on fundoscopic exam  Neck: Normal range of motion. Neck supple.  Cardiovascular: Normal rate, regular rhythm and normal  heart sounds.  Exam reveals no gallop and no friction rub.   No murmur heard. Pulmonary/Chest: Effort normal and breath sounds normal. No respiratory distress. She has no wheezes. She has no rales. She exhibits no tenderness.  Abdominal: Soft. She exhibits no distension. There is no tenderness.  Musculoskeletal: Normal range of motion. She exhibits no edema and no tenderness.  Lymphadenopathy:    She has no cervical adenopathy.  Neurological: She is alert and oriented to person, place, and time.  Skin: Skin is warm and dry. She is not diaphoretic.    ED Course  Procedures (including critical care time) Labs Review Labs Reviewed - No data to display Imaging Review No results found.  EKG Interpretation   None      Results for orders placed during the hospital encounter of 11/05/13  RAPID STREP SCREEN      Result Value Range   Streptococcus, Group A Screen (Direct) NEGATIVE  NEGATIVE    MDM   [name] is a 32 y.o. female with a PMH of asthma who presents to the ED for evaluation of eye problem.  Rapid strep ordered for sore throat.  Visual acuity, fluorescein stain, and tetracaine ordered.    Rechecks  7:35 AM = Bilateral Distance: 20/40 (pt doesnt wear glasses) ; R Distance: 20/30 ; L Distance: 20/40 8:21 AM = Fluorescein Woods lamp exam revealed a 3 mm x 3 mm circular area of uptake at the 6:00 position of both eyes.  No foreign bodies visualized.      Etiology of conjunctivitis is possibly due to viral illness/URI vs bacterial infection vs corneal abrasion from eye rubbing/flushing.  She was found to have bilateral corneal abrasions.  Her rapid strep was negative.  No eye edema/pain with eye movement to suggest orbital cellulitis.  Patient non-toxic/well appearing and afebrile.  She was given a prescription for Polytrim antibiotic eye drops.  She was instructed to follow-up with her PCP/ophthalmologist.  Return precautions were discussed.  Patient in agreement with  discharge and plan.    Final impressions: 1. Corneal abrasion, bilateral, initial encounter   2. URI (upper respiratory infection)   3. Conjunctivitis, acute, bilateral      Luiz Iron PA-C   This patient was discussed with Dr. Phill Myron, PA-C 11/06/13 [phone]

## 2013-11-05 NOTE — ED Notes (Signed)
Pt states that since last nite has been having irritation and draining pus from left eye.  Pt thinks she got vicks vapor rub in her eye yesterday morning

## 2013-11-07 LAB — CULTURE, GROUP A STREP

## 2013-11-07 NOTE — ED Provider Notes (Signed)
Medical screening examination/treatment/procedure(s) were performed by non-physician practitioner and as supervising physician I was immediately available for consultation/collaboration.  EKG Interpretation   None         Laray Anger, DO 11/07/13 2129

## 2018-04-24 DIAGNOSIS — Z9109 Other allergy status, other than to drugs and biological substances: Secondary | ICD-10-CM | POA: Insufficient documentation

## 2018-11-07 ENCOUNTER — Encounter: Payer: Self-pay | Admitting: Family Medicine

## 2018-11-07 ENCOUNTER — Ambulatory Visit: Payer: 59 | Admitting: Family Medicine

## 2018-11-07 VITALS — BP 114/73 | HR 85 | Temp 98.2°F | Resp 17 | Ht 64.0 in | Wt 185.0 lb

## 2018-11-07 DIAGNOSIS — Z7689 Persons encountering health services in other specified circumstances: Secondary | ICD-10-CM | POA: Diagnosis not present

## 2018-11-07 DIAGNOSIS — J019 Acute sinusitis, unspecified: Secondary | ICD-10-CM | POA: Insufficient documentation

## 2018-11-07 MED ORDER — FLUCONAZOLE 150 MG PO TABS: 150.0000 mg | ORAL_TABLET | Freq: Once | ORAL | 0 refills | Status: AC

## 2018-11-07 MED ORDER — DOXYCYCLINE HYCLATE 100 MG PO CAPS: 100.0000 mg | ORAL_CAPSULE | Freq: Two times a day (BID) | ORAL | 0 refills | Status: DC

## 2018-11-07 MED ORDER — PREDNISONE 10 MG PO TABS: 10.0000 mg | ORAL_TABLET | Freq: Every day | ORAL | 0 refills | Status: AC

## 2018-11-07 NOTE — Patient Instructions (Addendum)
[facility]    [name] was seen by Christina CourtsKimberly Harris, FNP today.   [name]'s primary care provider is Christina NeighborsHarris, Christina S, FNP.   For the best care possible, you should try to see Christina CourtsKimberly Harris, FNP-C whenever you come to the clinic.   We look forward to seeing you again soon!  If you have any questions about your visit today, please call us at [phone] or feel free to reach your primary care provider via MyChart.         Sinusitis, Adult Sinusitis is soreness and inflammation of your sinuses. Sinuses are hollow spaces in the bones around your face. They are located:  Around your eyes.  In the middle of your forehead.  Behind your nose.  In your cheekbones.  Your sinuses and nasal passages are lined with a stringy fluid (mucus). Mucus normally drains out of your sinuses. When your nasal tissues get inflamed or swollen, the mucus can get trapped or blocked so air cannot flow through your sinuses. This lets bacteria, viruses, and funguses grow, and that leads to infection. Follow these instructions at home: Medicines  Take, use, or apply over-the-counter and prescription medicines only as told by your doctor. These may include nasal sprays.  If you were prescribed an antibiotic medicine, take it as told by your doctor. Do not stop taking the antibiotic even if you start to feel better. Hydrate and Humidify  Drink enough water to keep your pee (urine) clear or pale yellow.  Use a cool mist humidifier to keep the humidity level in your home above 50%.  Breathe in steam for 10-15 minutes, 3-4 times a day or as told by your doctor. You can do this in the bathroom while a hot shower is running.  Try not to spend time in cool or dry air. Rest  Rest as much as possible.  Sleep with your head raised (elevated).  Make sure to get enough sleep each night. General instructions  Put a  warm, moist washcloth on your face 3-4 times a day or as told by your doctor. This will help with discomfort.  Wash your hands often with soap and water. If there is no soap and water, use hand sanitizer.  Do not smoke. Avoid being around people who are smoking (secondhand smoke).  Keep all follow-up visits as told by your doctor. This is important. Contact a doctor if:  You have a fever.  Your symptoms get worse.  Your symptoms do not get better within 10 days. Get help right away if:  You have a very bad headache.  You cannot stop throwing up (vomiting).  You have pain or swelling around your face or eyes.  You have trouble seeing.  You feel confused.  Your neck is stiff.  You have trouble breathing. This information is not intended to replace advice given to you by your health care provider. Make sure you discuss any questions you have with your health care provider. Document Released: 05/24/2008 Document Revised: 08/01/2016 Document Reviewed: 10/01/2015 Elsevier Interactive Patient Education  [employer]2018 Elsevier Inc.

## 2018-11-07 NOTE — Progress Notes (Signed)
Patient ID: [name], female    DOB: May 02, 1981, 37 y.o.   MRN: [id number]  PCP: Bing Neighbors, FNP  Chief Complaint  Patient presents with  . Establish Care  . Sinusitis    x 3 weeks. sinus pain/pressure, nasal congestion. taking OTC meds with no relief.    Subjective:  HPI [name] is a 37 y.o. female presents to establish care and sinusitis. Chronic problems include: asthma and allergies. Complains of persistent sinus pressure, mild frontal headache, eye itching,  and nasal congestion with mucus drainage. She was treated with Azithromycin approximately 2 months ago and reports no improvement after treatment. Recently started Xyzal 5 mg x 7 days ago and reports improvement of itchy eyes and post-nasal drainage. Asthma is well controlled.  Remainder of Review of Systems negative except as noted in the HPI.  Social History   Socioeconomic History  . Marital status: Single    Spouse name: Not on file  . Number of children: Not on file  . Years of education: Not on file  . Highest education level: Not on file  Occupational History  . Not on file  Social Needs  . Financial resource strain: Not on file  . Food insecurity:    Worry: Not on file    Inability: Not on file  . Transportation needs:    Medical: Not on file    Non-medical: Not on file  Tobacco Use  . Smoking status: Never Smoker  . Smokeless tobacco: Never Used  Substance and Sexual Activity  . Alcohol use: Yes    Comment: occ  . Drug use: No  . Sexual activity: Not on file  Lifestyle  . Physical activity:    Days per week: Not on file    Minutes per session: Not on file  . Stress: Not on file  Relationships  . Social connections:    Talks on phone: Not on file    Gets together: Not on file    Attends religious service: Not on file    Active member of club or organization: Not on file    Attends meetings of clubs or organizations: Not on file    Relationship status: Not on file  .  Intimate partner violence:    Fear of current or ex partner: Not on file    Emotionally abused: Not on file    Physically abused: Not on file    Forced sexual activity: Not on file  Other Topics Concern  . Not on file  Social History Narrative  . Not on file   Review of Systems Pertinent negatives listed in HPI  Allergies  Allergen Reactions  . Benadryl [Diphenhydramine]   . Eggs Or Egg-Derived Products   . Penicillins     Prior to Admission medications   Medication Sig Start Date End Date Taking? Authorizing Provider  albuterol (VENTOLIN HFA) 108 (90 Base) MCG/ACT inhaler Inhale 2 puffs into the lungs every 4 (four) hours as needed. 07/08/14  Yes [provider]  budesonide-formoterol (SYMBICORT) 160-4.5 MCG/ACT inhaler Inhale 1 puff into the lungs daily.   Yes [provider]  levocetirizine (XYZAL) 5 MG tablet Take 5 mg by mouth every evening.   Yes [provider]    Past Medical, Surgical Family and Social History reviewed and updated. Denies family history significant for lung disease, heart disease, or cancers.    Objective:   Today's Vitals   11/07/18 0903  Resp: 17  Weight: 185 lb (83.9 kg)  Height: 5\' 4"  (1.626 m)    Wt Readings from Last 3 Encounters:  11/07/18 185 lb (83.9 kg)  11/05/13 170 lb (77.1 kg)   Physical Exam General appearance: alert, well developed, well nourished, cooperative and in no distress Head: Normocephalic, without obvious abnormality, atraumatic Respiratory: Respirations even and unlabored, normal respiratory rate Heart: rate and rhythm normal. No gallop or murmurs noted on exam  Extremities: No gross deformities Skin: Skin color, texture, turgor normal. No rashes seen  Psych: Appropriate mood and affect. Neurologic: Mental status: Alert, oriented to person, place, and time, thought content appropriate.    Assessment & Plan:  1. Encounter to establish care 2. Acute sinusitis, recurrence not specified,  unspecified location Doxycyline 1 tablet every 12 hours x 10 days.  Take Diflucan 150 mg once if vaginal irritation develops (female). Continue Levocetirizine 5 mg at bedtime.  Return for Complete Physical Exam and fasting labs as time permits .   A total of 25 minutes spent, greater than 50 % of this time was spent counseling and coordination of care.  -The patient was given clear instructions to go to ER or return to medical center if symptoms do not improve, worsen or new problems develop. The patient verbalized understanding.    Joaquin CourtsKimberly Gerritt Galentine, FNP [facility] [address], [place] [id number] [phone]: [phone]

## 2019-03-21 ENCOUNTER — Telehealth: Payer: Self-pay | Admitting: Family Medicine

## 2019-03-21 MED ORDER — ALBUTEROL SULFATE HFA 108 (90 BASE) MCG/ACT IN AERS: 2.0000 | INHALATION_SPRAY | RESPIRATORY_TRACT | 2 refills | Status: DC | PRN

## 2019-03-21 MED ORDER — BUDESONIDE-FORMOTEROL FUMARATE 160-4.5 MCG/ACT IN AERO: 1.0000 | INHALATION_SPRAY | Freq: Every day | RESPIRATORY_TRACT | 2 refills | Status: DC

## 2019-03-21 NOTE — Telephone Encounter (Signed)
Patient called requesting refills of   budesonide-formoterol (SYMBICORT) 160-4.5 MCG/ACT inhaler [[id number]   albuterol (VENTOLIN HFA) 108 (90 Base) MCG/ACT inhaler [[id number]]   Patient uses [facility] [address], PCP has never filled these medications before, please follow up.

## 2019-05-24 DIAGNOSIS — J45909 Unspecified asthma, uncomplicated: Secondary | ICD-10-CM | POA: Insufficient documentation

## 2019-05-28 ENCOUNTER — Encounter: Payer: Self-pay | Admitting: Family Medicine

## 2019-05-28 ENCOUNTER — Other Ambulatory Visit: Payer: Self-pay

## 2019-05-28 ENCOUNTER — Other Ambulatory Visit (INDEPENDENT_AMBULATORY_CARE_PROVIDER_SITE_OTHER): Payer: Managed Care, Other (non HMO)

## 2019-05-28 ENCOUNTER — Ambulatory Visit (INDEPENDENT_AMBULATORY_CARE_PROVIDER_SITE_OTHER): Payer: Managed Care, Other (non HMO) | Admitting: Family Medicine

## 2019-05-28 VITALS — BP 101/72 | HR 79 | Temp 98.1°F | Resp 17 | Ht 64.0 in | Wt 184.8 lb

## 2019-05-28 DIAGNOSIS — Z0000 Encounter for general adult medical examination without abnormal findings: Secondary | ICD-10-CM

## 2019-05-28 DIAGNOSIS — Z13220 Encounter for screening for lipoid disorders: Secondary | ICD-10-CM

## 2019-05-28 DIAGNOSIS — Z130 Encounter for screening for diseases of the blood and blood-forming organs and certain disorders involving the immune mechanism: Secondary | ICD-10-CM

## 2019-05-28 DIAGNOSIS — Z1329 Encounter for screening for other suspected endocrine disorder: Secondary | ICD-10-CM

## 2019-05-28 DIAGNOSIS — Z1389 Encounter for screening for other disorder: Secondary | ICD-10-CM | POA: Diagnosis not present

## 2019-05-28 DIAGNOSIS — Z131 Encounter for screening for diabetes mellitus: Secondary | ICD-10-CM

## 2019-05-28 DIAGNOSIS — Z114 Encounter for screening for human immunodeficiency virus [HIV]: Secondary | ICD-10-CM

## 2019-05-28 LAB — POCT URINALYSIS DIP (CLINITEK)
Bilirubin, UA: NEGATIVE
Blood, UA: NEGATIVE
Glucose, UA: NEGATIVE mg/dL
Ketones, POC UA: NEGATIVE mg/dL
Leukocytes, UA: NEGATIVE
Nitrite, UA: NEGATIVE
POC PROTEIN,UA: NEGATIVE
Spec Grav, UA: 1.01 (ref 1.010–1.025)
Urobilinogen, UA: 0.2 E.U./dL
pH, UA: 6 (ref 5.0–8.0)

## 2019-05-28 MED ORDER — ALBUTEROL SULFATE (2.5 MG/3ML) 0.083% IN NEBU: 2.5000 mg | INHALATION_SOLUTION | Freq: Four times a day (QID) | RESPIRATORY_TRACT | 2 refills | Status: DC | PRN

## 2019-05-28 NOTE — Patient Instructions (Signed)

## 2019-05-28 NOTE — Progress Notes (Signed)
Patient here for fasting labs. KWalker, CMA. 

## 2019-05-28 NOTE — Progress Notes (Signed)
Patient ID: [name], female    DOB: 1981-01-11, 38 y.o.   MRN: [id number]  PCP: Scot Jun, FNP  Chief Complaint  Patient presents with  . Annual Exam    Subjective:  HPI [name] is a 38 y.o. female, nonsmoker presents for complete physical exam.   Health Promotion: Routine physical activity: uses APP 3-4 times per week 30 minutes -1 hour Kickboxing, and weight-lifting.  Current BMI: Body mass index is 31.72 kg/m.  Dietary habits: more balanced diet  Health Screening Current/Overdue:   Immunizations-Current  PAP- Scheduled [facility], in the process of starting a family with spouse. Mammogram, Began at age 56 Last Dental Exam: Overdue , will schedule  Last Eye Exam:  No recent vision , no corrective wear lenses       Current home medications include: Prior to Admission medications   Medication Sig Start Date End Date Taking? Authorizing Provider  albuterol (PROVENTIL) (2.5 MG/3ML) 0.083% nebulizer solution Take 3 mLs (2.5 mg total) by nebulization every 6 (six) hours as needed for wheezing or shortness of breath. 05/28/19  Yes Scot Jun, FNP  albuterol (VENTOLIN HFA) 108 (90 Base) MCG/ACT inhaler Inhale 2 puffs into the lungs every 4 (four) hours as needed. 03/21/19  Yes Scot Jun, FNP  budesonide-formoterol (SYMBICORT) 160-4.5 MCG/ACT inhaler Inhale 1 puff into the lungs daily. 03/21/19  Yes Scot Jun, FNP  loratadine (CLARITIN) 10 MG tablet Take 10 mg by mouth daily.   Yes [provider]   Grandmother paternal -type 2 diabetes  No heart disease No hypertension  No cancer or breast cancer Endometriosis   Allergies  Allergen Reactions  . Benadryl [Diphenhydramine]   . Eggs Or Egg-Derived Products   . Penicillins     Social History   Socioeconomic History  . Marital status: Single    Spouse name: Not on file  . Number of children: Not on file  . Years of education: Not on file  . Highest education  level: Not on file  Occupational History  . Not on file  Social Needs  . Financial resource strain: Not on file  . Food insecurity:    Worry: Not on file    Inability: Not on file  . Transportation needs:    Medical: Not on file    Non-medical: Not on file  Tobacco Use  . Smoking status: Never Smoker  . Smokeless tobacco: Never Used  Substance and Sexual Activity  . Alcohol use: Yes    Comment: occ  . Drug use: No  . Sexual activity: Not on file  Lifestyle  . Physical activity:    Days per week: Not on file    Minutes per session: Not on file  . Stress: Not on file  Relationships  . Social connections:    Talks on phone: Not on file    Gets together: Not on file    Attends religious service: Not on file    Active member of club or organization: Not on file    Attends meetings of clubs or organizations: Not on file    Relationship status: Not on file  . Intimate partner violence:    Fear of current or ex partner: Not on file    Emotionally abused: Not on file    Physically abused: Not on file    Forced sexual activity: Not on file  Other Topics Concern  . Not on file  Social History Narrative  . Not on file  Review of Systems Pertinent negatives listed in HPI Past Medical, Surgical Family and Social History reviewed and updated.  Objective:   Today's Vitals   05/28/19 0924  BP: 101/72  Pulse: 79  Resp: 17  Temp: 98.1 F (36.7 C)  TempSrc: Temporal  SpO2: 96%  Weight: 184 lb 12.8 oz (83.8 kg)  Height: 5\' 4"  (1.626 m)    Wt Readings from Last 3 Encounters:  05/28/19 184 lb 12.8 oz (83.8 kg)  11/07/18 185 lb (83.9 kg)  11/05/13 170 lb (77.1 kg)   Physical Exam General appearance: alert, well developed, well nourished, cooperative and in no distress Head: Normocephalic, without obvious abnormality, atraumatic Respiratory: Respirations even and unlabored, normal respiratory rate Heart: rate and rhythm normal. No gallop or murmurs noted on exam   Abdomen: BS +, no distention, no rebound tenderness, or no mass Extremities: No gross deformities Skin: Skin color, texture, turgor normal. No rashes seen  Psych: Appropriate mood and affect. Neurologic: Mental status: Alert, oriented to person, place, and time, thought content appropriate.    Assessment & Plan:  1. Annual physical exam Age anticipatory guidance provided. Refilled medication per request. Encouraged efforts to reduce weight include engaging in physical activity as tolerated with goal of 150 minutes per week. Improve dietary choices and eat a meal regimen consistent with a Mediterranean or DASH diet. Reduce simple carbohydrates. Do not skip meals and eat healthy snacks throughout the day to avoid over-eating at dinner. Set a goal weight loss that is achievable for you.   RTC: 12 months annual CPE   Joaquin CourtsKimberly Adeleigh Barletta, FNP [facility] [address], [place] [id number] [phone]: [phone]

## 2019-05-28 NOTE — Addendum Note (Signed)
Addended by: Carylon Perches on: 05/28/2019 09:57 AM   Modules accepted: Orders

## 2019-05-29 LAB — COMPREHENSIVE METABOLIC PANEL
ALT: 13 IU/L (ref 0–32)
AST: 17 IU/L (ref 0–40)
Albumin/Globulin Ratio: 1.6 (ref 1.2–2.2)
Albumin: 4.1 g/dL (ref 3.8–4.8)
Alkaline Phosphatase: 46 IU/L (ref 39–117)
BUN/Creatinine Ratio: 13 (ref 9–23)
BUN: 11 mg/dL (ref 6–20)
Bilirubin Total: 0.6 mg/dL (ref 0.0–1.2)
CO2: 22 mmol/L (ref 20–29)
Calcium: 9.5 mg/dL (ref 8.7–10.2)
Chloride: 102 mmol/L (ref 96–106)
Creatinine, Ser: 0.82 mg/dL (ref 0.57–1.00)
GFR calc Af Amer: 106 mL/min/{1.73_m2} (ref >59)
GFR calc non Af Amer: 92 mL/min/{1.73_m2} (ref >59)
Globulin, Total: 2.5 g/dL (ref 1.5–4.5)
Glucose: 98 mg/dL (ref 65–99)
Potassium: 4.8 mmol/L (ref 3.5–5.2)
Sodium: 140 mmol/L (ref 134–144)
Total Protein: 6.6 g/dL (ref 6.0–8.5)

## 2019-05-29 LAB — HEMOGLOBIN A1C
Est. average glucose Bld gHb Est-mCnc: 114 mg/dL
Hgb A1c MFr Bld: 5.6 % (ref 4.8–5.6)

## 2019-05-29 LAB — THYROID PANEL WITH TSH
Free Thyroxine Index: 1.7 (ref 1.2–4.9)
T3 Uptake Ratio: 27 % (ref 24–39)
T4, Total: 6.3 ug/dL (ref 4.5–12.0)
TSH: 2.14 u[IU]/mL (ref 0.450–4.500)

## 2019-05-29 LAB — HIV ANTIBODY (ROUTINE TESTING W REFLEX): HIV Screen 4th Generation wRfx: NONREACTIVE

## 2019-05-29 LAB — CBC
Hematocrit: 44.4 % (ref 34.0–46.6)
Hemoglobin: 14.1 g/dL (ref 11.1–15.9)
MCH: 31.5 pg (ref 26.6–33.0)
MCHC: 31.8 g/dL (ref 31.5–35.7)
MCV: 99 fL — ABNORMAL HIGH (ref 79–97)
Platelets: 362 10*3/uL (ref 150–450)
RBC: 4.48 x10E6/uL (ref 3.77–5.28)
RDW: 13 % (ref 11.7–15.4)
WBC: 7.6 10*3/uL (ref 3.4–10.8)

## 2019-05-29 LAB — LIPID PANEL
Chol/HDL Ratio: 3 ratio (ref 0.0–4.4)
Cholesterol, Total: 182 mg/dL (ref 100–199)
HDL: 61 mg/dL (ref >39)
LDL Calculated: 110 mg/dL — ABNORMAL HIGH (ref 0–99)
Triglycerides: 54 mg/dL (ref 0–149)
VLDL Cholesterol Cal: 11 mg/dL (ref 5–40)

## 2019-05-30 ENCOUNTER — Encounter: Payer: Self-pay | Admitting: Family Medicine

## 2019-06-21 ENCOUNTER — Other Ambulatory Visit: Payer: Self-pay | Admitting: Family Medicine

## 2019-06-21 NOTE — Telephone Encounter (Signed)
Forwarding medication refill to PCP for review. 

## 2019-09-12 ENCOUNTER — Other Ambulatory Visit: Payer: Self-pay | Admitting: Family Medicine

## 2019-09-12 NOTE — Telephone Encounter (Signed)
Requested medication (s) are due for refill today: yes  Requested medication (s) are on the active medication list: yes  Last refill:  06/29/2019  Future visit scheduled: yes  Notes to clinic:  Review for refill   Requested Prescriptions  Pending Prescriptions Disp Refills   albuterol (VENTOLIN HFA) 108 (90 Base) MCG/ACT inhaler [Pharmacy Med Name: ALBUTEROL HFA (VENTOLIN) INH]  2    Sig: TAKE 2 PUFFS BY MOUTH EVERY 4 HOURS AS NEEDED     There is no refill protocol information for this order

## 2019-09-14 ENCOUNTER — Telehealth: Payer: Self-pay

## 2019-09-14 NOTE — Telephone Encounter (Signed)
Called patient to do their pre-visit COVID screening.  Call went to voicemail. Unable to do prescreening.  

## 2019-09-17 ENCOUNTER — Ambulatory Visit: Payer: Managed Care, Other (non HMO)

## 2019-10-12 ENCOUNTER — Telehealth: Payer: Self-pay

## 2019-10-12 NOTE — Telephone Encounter (Signed)
Called patient to do their pre-visit COVID screening.  Call went to voicemail. Unable to do prescreening.  

## 2019-10-12 NOTE — Progress Notes (Signed)
Patient here for pap smear. No history of abnormal pap smears.  Doesn't get flu shots due to egg allergy.

## 2019-10-12 NOTE — Patient Instructions (Addendum)
Pap Test Why am I having this test? A Pap test, also called a Pap smear, is a screening test to check for signs of:  Cancer of the vagina, cervix, and uterus. The cervix is the lower part of the uterus that opens into the vagina.  Infection.  Changes that may be a sign that cancer is developing (precancerous changes). Women need this test on a regular basis. In general, you should have a Pap test every 3 years until you reach menopause or age 37. Women aged 30-60 may choose to have their Pap test done at the same time as an HPV (human papillomavirus) test every 5 years (instead of every 3 years). Your health care provider may recommend having Pap tests more or less often depending on your medical conditions and past Pap test results. What kind of sample is taken?  Your health care provider will collect a sample of cells from the surface of your cervix. This will be done using a small cotton swab, plastic spatula, or brush. This sample is often collected during a pelvic exam, when you are lying on your back on an exam table with feet in footrests (stirrups). In some cases, fluids (secretions) from the cervix or vagina may also be collected. How do I prepare for this test?  Be aware of where you are in your menstrual cycle. If you are menstruating on the day of the test, you may be asked to reschedule.  You may need to reschedule if you have a known vaginal infection on the day of the test.  Follow instructions from your health care provider about: ? Changing or stopping your regular medicines. Some medicines can cause abnormal test results, such as digitalis and tetracycline. ? Avoiding douching or taking a bath the day before or the day of the test. Tell a health care provider about:  Any allergies you have.  All medicines you are taking, including vitamins, herbs, eye drops, creams, and over-the-counter medicines.  Any blood disorders you have.  Any surgeries you have had.  Any  medical conditions you have.  Whether you are pregnant or may be pregnant. How are the results reported? Your test results will be reported as either abnormal or normal. A false-positive result can occur. A false positive is incorrect because it means that a condition is present when it is not. A false-negative result can occur. A false negative is incorrect because it means that a condition is not present when it is. What do the results mean? A normal test result means that you do not have signs of cancer of the vagina, cervix, or uterus. An abnormal result may mean that you have:  Cancer. A Pap test by itself is not enough to diagnose cancer. You will have more tests done in this case.  Precancerous changes in your vagina, cervix, or uterus.  Inflammation of the cervix.  An STD (sexually transmitted disease).  A fungal infection.  A parasite infection. Talk with your health care provider about what your results mean. Questions to ask your health care provider Ask your health care provider, or the department that is doing the test:  When will my results be ready?  How will I get my results?  What are my treatment options?  What other tests do I need?  What are my next steps? Summary  In general, women should have a Pap test every 3 years until they reach menopause or age 37.  Your health care provider will collect a   sample of cells from the surface of your cervix. This will be done using a small cotton swab, plastic spatula, or brush.  In some cases, fluids (secretions) from the cervix or vagina may also be collected. This information is not intended to replace advice given to you by your health care provider. Make sure you discuss any questions you have with your health care provider. Document Released: 02/26/2003 Document Revised: 08/15/2017 Document Reviewed: 08/15/2017 Elsevier Patient Education  2020 [place] 41-58 Years Old, Female  Preventive care refers to visits with your health care provider and lifestyle choices that can promote health and wellness. This includes:  A yearly physical exam. This may also be called an annual well check.  Regular dental visits and eye exams.  Immunizations.  Screening for certain conditions.  Healthy lifestyle choices, such as eating a healthy diet, getting regular exercise, not using drugs or products that contain nicotine and tobacco, and limiting alcohol use. What can I expect for my preventive care visit? Physical exam Your health care provider will check your:  Height and weight. This may be used to calculate body mass index (BMI), which tells if you are at a healthy weight.  Heart rate and blood pressure.  Skin for abnormal spots. Counseling Your health care provider may ask you questions about your:  Alcohol, tobacco, and drug use.  Emotional well-being.  Home and relationship well-being.  Sexual activity.  Eating habits.  Work and work Statistician.  Method of birth control.  Menstrual cycle.  Pregnancy history. What immunizations do I need?  Influenza (flu) vaccine  This is recommended every year. Tetanus, diphtheria, and pertussis (Tdap) vaccine  You may need a Td booster every 10 years. Varicella (chickenpox) vaccine  You may need this if you have not been vaccinated. Human papillomavirus (HPV) vaccine  If recommended by your health care provider, you may need three doses over 6 months. Measles, mumps, and rubella (MMR) vaccine  You may need at least one dose of MMR. You may also need a second dose. Meningococcal conjugate (MenACWY) vaccine  One dose is recommended if you are age 47-21 years and a first-year college student living in a residence hall, or if you have one of several medical conditions. You may also need additional booster doses. Pneumococcal conjugate (PCV13) vaccine  You may need this if you have certain conditions and  were not previously vaccinated. Pneumococcal polysaccharide (PPSV23) vaccine  You may need one or two doses if you smoke cigarettes or if you have certain conditions. Hepatitis A vaccine  You may need this if you have certain conditions or if you travel or work in places where you may be exposed to hepatitis A. Hepatitis B vaccine  You may need this if you have certain conditions or if you travel or work in places where you may be exposed to hepatitis B. Haemophilus influenzae type b (Hib) vaccine  You may need this if you have certain conditions. You may receive vaccines as individual doses or as more than one vaccine together in one shot (combination vaccines). Talk with your health care provider about the risks and benefits of combination vaccines. What tests do I need?  Blood tests  Lipid and cholesterol levels. These may be checked every 5 years starting at age 38.  Hepatitis C test.  Hepatitis B test. Screening  Diabetes screening. This is done by checking your blood sugar (glucose) after you have not eaten for a while (fasting).  Sexually transmitted  disease (STD) testing.  BRCA-related cancer screening. This may be done if you have a family history of breast, ovarian, tubal, or peritoneal cancers.  Pelvic exam and Pap test. This may be done every 3 years starting at age 7. Starting at age 89, this may be done every 5 years if you have a Pap test in combination with an HPV test. Talk with your health care provider about your test results, treatment options, and if necessary, the need for more tests. Follow these instructions at home: Eating and drinking   Eat a diet that includes fresh fruits and vegetables, whole grains, lean protein, and low-fat dairy.  Take vitamin and mineral supplements as recommended by your health care provider.  Do not drink alcohol if: ? Your health care provider tells you not to drink. ? You are pregnant, may be pregnant, or are planning  to become pregnant.  If you drink alcohol: ? Limit how much you have to 0-1 drink a day. ? Be aware of how much alcohol is in your drink. In the [place] one drink equals one 12 oz bottle of beer (355 mL), one 5 oz glass of wine (148 mL), or one 1 oz glass of hard liquor (44 mL). Lifestyle  Take daily care of your teeth and gums.  Stay active. Exercise for at least 30 minutes on 5 or more days each week.  Do not use any products that contain nicotine or tobacco, such as cigarettes, e-cigarettes, and chewing tobacco. If you need help quitting, ask your health care provider.  If you are sexually active, practice safe sex. Use a condom or other form of birth control (contraception) in order to prevent pregnancy and STIs (sexually transmitted infections). If you plan to become pregnant, see your health care provider for a preconception visit. What's next?  Visit your health care provider once a year for a well check visit.  Ask your health care provider how often you should have your eyes and teeth checked.  Stay up to date on all vaccines. This information is not intended to replace advice given to you by your health care provider. Make sure you discuss any questions you have with your health care provider. Document Released: 02/01/2002 Document Revised: 08/17/2018 Document Reviewed: 08/17/2018 Elsevier Patient Education  2020 [employer]  Preventive Care 88-75 Years Old, Female Preventive care refers to visits with your health care provider and lifestyle choices that can promote health and wellness. This includes:  A yearly physical exam. This may also be called an annual well check.  Regular dental visits and eye exams.  Immunizations.  Screening for certain conditions.  Healthy lifestyle choices, such as eating a healthy diet, getting regular exercise, not using drugs or products that contain nicotine and tobacco, and limiting alcohol use. What can I expect for my preventive  care visit? Physical exam Your health care provider will check your:  Height and weight. This may be used to calculate body mass index (BMI), which tells if you are at a healthy weight.  Heart rate and blood pressure.  Skin for abnormal spots. Counseling Your health care provider may ask you questions about your:  Alcohol, tobacco, and drug use.  Emotional well-being.  Home and relationship well-being.  Sexual activity.  Eating habits.  Work and work Statistician.  Method of birth control.  Menstrual cycle.  Pregnancy history. What immunizations do I need?  Influenza (flu) vaccine  This is recommended every year. Tetanus, diphtheria, and pertussis (Tdap) vaccine  You may need a Td booster every 10 years. Varicella (chickenpox) vaccine  You may need this if you have not been vaccinated. Zoster (shingles) vaccine  You may need this after age 84. Measles, mumps, and rubella (MMR) vaccine  You may need at least one dose of MMR if you were born in 1957 or later. You may also need a second dose. Pneumococcal conjugate (PCV13) vaccine  You may need this if you have certain conditions and were not previously vaccinated. Pneumococcal polysaccharide (PPSV23) vaccine  You may need one or two doses if you smoke cigarettes or if you have certain conditions. Meningococcal conjugate (MenACWY) vaccine  You may need this if you have certain conditions. Hepatitis A vaccine  You may need this if you have certain conditions or if you travel or work in places where you may be exposed to hepatitis A. Hepatitis B vaccine  You may need this if you have certain conditions or if you travel or work in places where you may be exposed to hepatitis B. Haemophilus influenzae type b (Hib) vaccine  You may need this if you have certain conditions. Human papillomavirus (HPV) vaccine  If recommended by your health care provider, you may need three doses over 6 months. You may receive  vaccines as individual doses or as more than one vaccine together in one shot (combination vaccines). Talk with your health care provider about the risks and benefits of combination vaccines. What tests do I need? Blood tests  Lipid and cholesterol levels. These may be checked every 5 years, or more frequently if you are over 78 years old.  Hepatitis C test.  Hepatitis B test. Screening  Lung cancer screening. You may have this screening every year starting at age 100 if you have a 30-pack-year history of smoking and currently smoke or have quit within the past 15 years.  Colorectal cancer screening. All adults should have this screening starting at age 63 and continuing until age 60. Your health care provider may recommend screening at age 88 if you are at increased risk. You will have tests every 1-10 years, depending on your results and the type of screening test.  Diabetes screening. This is done by checking your blood sugar (glucose) after you have not eaten for a while (fasting). You may have this done every 1-3 years.  Mammogram. This may be done every 1-2 years. Talk with your health care provider about when you should start having regular mammograms. This may depend on whether you have a family history of breast cancer.  BRCA-related cancer screening. This may be done if you have a family history of breast, ovarian, tubal, or peritoneal cancers.  Pelvic exam and Pap test. This may be done every 3 years starting at age 73. Starting at age 80, this may be done every 5 years if you have a Pap test in combination with an HPV test. Other tests  Sexually transmitted disease (STD) testing.  Bone density scan. This is done to screen for osteoporosis. You may have this scan if you are at high risk for osteoporosis. Follow these instructions at home: Eating and drinking  Eat a diet that includes fresh fruits and vegetables, whole grains, lean protein, and low-fat dairy.  Take vitamin  and mineral supplements as recommended by your health care provider.  Do not drink alcohol if: ? Your health care provider tells you not to drink. ? You are pregnant, may be pregnant, or are planning to become pregnant.  If  you drink alcohol: ? Limit how much you have to 0-1 drink a day. ? Be aware of how much alcohol is in your drink. In the [place] one drink equals one 12 oz bottle of beer (355 mL), one 5 oz glass of wine (148 mL), or one 1 oz glass of hard liquor (44 mL). Lifestyle  Take daily care of your teeth and gums.  Stay active. Exercise for at least 30 minutes on 5 or more days each week.  Do not use any products that contain nicotine or tobacco, such as cigarettes, e-cigarettes, and chewing tobacco. If you need help quitting, ask your health care provider.  If you are sexually active, practice safe sex. Use a condom or other form of birth control (contraception) in order to prevent pregnancy and STIs (sexually transmitted infections).  If told by your health care provider, take low-dose aspirin daily starting at age 97. What's next?  Visit your health care provider once a year for a well check visit.  Ask your health care provider how often you should have your eyes and teeth checked.  Stay up to date on all vaccines. This information is not intended to replace advice given to you by your health care provider. Make sure you discuss any questions you have with your health care provider. Document Released: 01/02/2016 Document Revised: 08/17/2018 Document Reviewed: 08/17/2018 Elsevier Patient Education  2020 [employer].

## 2019-10-15 ENCOUNTER — Ambulatory Visit (INDEPENDENT_AMBULATORY_CARE_PROVIDER_SITE_OTHER): Payer: Managed Care, Other (non HMO) | Admitting: Family Medicine

## 2019-10-15 ENCOUNTER — Other Ambulatory Visit: Payer: Self-pay

## 2019-10-15 ENCOUNTER — Other Ambulatory Visit (HOSPITAL_COMMUNITY)
Admission: RE | Admit: 2019-10-15 | Discharge: 2019-10-15 | Disposition: A | Discharge status: Home / Self Care | Payer: Managed Care, Other (non HMO) | Source: Ambulatory Visit | Attending: Family Medicine | Admitting: Family Medicine

## 2019-10-15 VITALS — BP 119/83 | HR 92 | Temp 97.3°F | Resp 17 | Wt 188.8 lb

## 2019-10-15 DIAGNOSIS — N888 Other specified noninflammatory disorders of cervix uteri: Secondary | ICD-10-CM | POA: Diagnosis not present

## 2019-10-15 DIAGNOSIS — Z124 Encounter for screening for malignant neoplasm of cervix: Secondary | ICD-10-CM | POA: Insufficient documentation

## 2019-10-15 DIAGNOSIS — N898 Other specified noninflammatory disorders of vagina: Secondary | ICD-10-CM

## 2019-10-15 NOTE — Progress Notes (Signed)
Established Patient Office Visit  Subjective:  Patient ID: [name], female    DOB: 08-31-1981  Age: 38 y.o. MRN: [id number]  CC:  Chief Complaint  Patient presents with  . Gynecologic Exam    HPI [name], 38 year old African-American female, who presents for Pap smear for screening for cervical cancer.  She would also like testing for infection if there is any presence of vaginal discharge.  She reports that her menses occur regularly with last menses being on 09/27/2019.  She believes that her last Pap smear was around 3 years ago.  She denies any history of abnormal Pap smears.  She denies any current issues with abdominal or pelvic pain.  She was found no abnormalities on self breast exam.  She denies any family history of GYN cancer but has family history of mother with endometriosis.  She has a history of asthma but states that this is fairly well controlled.  She does have some increased symptoms with changes in the weather and exposure to grasses/pollen.  Past Medical History:  Diagnosis Date  . Asthma     Past Surgical History:  Procedure Laterality Date  . WISDOM TOOTH EXTRACTION      Family History  Problem Relation Age of Onset  . Healthy Mother   . Healthy Father   . Stroke Neg Hx   . Heart attack Neg Hx     Social History   Socioeconomic History  . Marital status: Single    Spouse name: Not on file  . Number of children: Not on file  . Years of education: Not on file  . Highest education level: Not on file  Occupational History  . Not on file  Social Needs  . Financial resource strain: Not on file  . Food insecurity    Worry: Not on file    Inability: Not on file  . Transportation needs    Medical: Not on file    Non-medical: Not on file  Tobacco Use  . Smoking status: Never Smoker  . Smokeless tobacco: Never Used  Substance and Sexual Activity  . Alcohol use: Yes    Comment: occ  . Drug use: No  . Sexual activity: Not on  file  Lifestyle  . Physical activity    Days per week: Not on file    Minutes per session: Not on file  . Stress: Not on file  Relationships  . Social Musicianconnections    Talks on phone: Not on file    Gets together: Not on file    Attends religious service: Not on file    Active member of club or organization: Not on file    Attends meetings of clubs or organizations: Not on file    Relationship status: Not on file  . Intimate partner violence    Fear of current or ex partner: Not on file    Emotionally abused: Not on file    Physically abused: Not on file    Forced sexual activity: Not on file  Other Topics Concern  . Not on file  Social History Narrative  . Not on file    Outpatient Medications Prior to Visit  Medication Sig Dispense Refill  . albuterol (PROVENTIL) (2.5 MG/3ML) 0.083% nebulizer solution Take 3 mLs (2.5 mg total) by nebulization every 6 (six) hours as needed for wheezing or shortness of breath. 75 mL 2  . albuterol (VENTOLIN HFA) 108 (90 Base) MCG/ACT inhaler TAKE 2 PUFFS BY MOUTH EVERY 4 HOURS  AS NEEDED 18 g 0  . loratadine (CLARITIN) 10 MG tablet Take 10 mg by mouth daily.    . SYMBICORT 160-4.5 MCG/ACT inhaler TAKE 1 PUFF BY MOUTH EVERY DAY 10.2 Inhaler 2   No facility-administered medications prior to visit.     Allergies  Allergen Reactions  . Benadryl [Diphenhydramine]   . Eggs Or Egg-Derived Products   . Penicillins Other (See Comments)    Never had PCN due to parents having allergy    ROS Review of Systems  Constitutional: Negative for chills and fever.  HENT: Negative for sore throat and trouble swallowing.   Eyes: Negative for photophobia and visual disturbance.  Respiratory: Positive for shortness of breath (Occasional due to asthma with changes in weather). Negative for cough.   Cardiovascular: Negative for chest pain, palpitations and leg swelling.  Gastrointestinal: Negative for abdominal pain, blood in stool, constipation, diarrhea and  nausea.  Endocrine: Negative for cold intolerance, heat intolerance, polydipsia, polyphagia and polyuria.  Genitourinary: Negative for dysuria, frequency, menstrual problem and vaginal pain.  Musculoskeletal: Negative for arthralgias and back pain.  Allergic/Immunologic: Positive for environmental allergies and food allergies (eggs).  Neurological: Negative for dizziness and headaches.  Hematological: Negative for adenopathy. Does not bruise/bleed easily.  Psychiatric/Behavioral: Negative for self-injury and suicidal ideas. The patient is not nervous/anxious.       Objective:    Physical Exam  Constitutional: She is oriented to person, place, and time. She appears well-developed and well-nourished.  Neck: Normal range of motion. Neck supple. No JVD present.  Cardiovascular: Normal rate and regular rhythm.  Pulmonary/Chest: Effort normal and breath sounds normal. She has no wheezes.  Normal breast exam, no axillary adenopathy, no skin changes bilaterally and no palpable masses  Abdominal: Soft. There is no abdominal tenderness. There is no rebound and no guarding.  Genitourinary:    Vaginal discharge (Whitish clumpy discharge in the vaginal vault and canal) present.     Genitourinary Comments: Patient with some friability of the cervix with collection of cells for Pap smear.  No adnexal tenderness and no cervical motion tenderness   Musculoskeletal:        General: No tenderness or edema.     Comments: No CVA tenderness or reproducible back pain  Lymphadenopathy:    She has no cervical adenopathy.  Neurological: She is alert and oriented to person, place, and time.  Skin: Skin is warm and dry. No rash noted.  Psychiatric: She has a normal mood and affect. Her behavior is normal.  Nursing note and vitals reviewed.   BP 119/83   Pulse 92   Temp (!) 97.3 F (36.3 C) (Temporal)   Resp 17   Wt 188 lb 12.8 oz (85.6 kg)   LMP 09/27/2019 (Exact Date)   SpO2 97%   BMI 32.41 kg/m  Wt  Readings from Last 3 Encounters:  10/15/19 188 lb 12.8 oz (85.6 kg)  05/28/19 184 lb 12.8 oz (83.8 kg)  11/07/18 185 lb (83.9 kg)     Health Maintenance Due  Topic Date Due  . PAP SMEAR-Modifier  06/10/2012   Pap smear done at today's visit  Lab Results  Component Value Date   TSH 2.140 05/28/2019   Lab Results  Component Value Date   WBC 7.6 05/28/2019   HGB 14.1 05/28/2019   HCT 44.4 05/28/2019   MCV 99 (H) 05/28/2019   PLT 362 05/28/2019   Lab Results  Component Value Date   NA 140 05/28/2019   K 4.8  05/28/2019   CO2 22 05/28/2019   GLUCOSE 98 05/28/2019   BUN 11 05/28/2019   CREATININE 0.82 05/28/2019   BILITOT 0.6 05/28/2019   ALKPHOS 46 05/28/2019   AST 17 05/28/2019   ALT 13 05/28/2019   PROT 6.6 05/28/2019   ALBUMIN 4.1 05/28/2019   CALCIUM 9.5 05/28/2019   Lab Results  Component Value Date   CHOL 182 05/28/2019   Lab Results  Component Value Date   HDL 61 05/28/2019   Lab Results  Component Value Date   LDLCALC 110 (H) 05/28/2019   Lab Results  Component Value Date   TRIG 54 05/28/2019   Lab Results  Component Value Date   CHOLHDL 3.0 05/28/2019   Lab Results  Component Value Date   HGBA1C 5.6 05/28/2019      Assessment & Plan:  1. Cervical cancer screening Patient had Pap smear done at today's visit as a screening test for cervical cancer.  She will be notified of the results and if any further evaluation or treatment is needed based on these results.  Patient was given handout on preventative care for her age group and also for patient's 40-64.  Annual physical examination encouraged. - Cytology - PAP([facility])  2. Vaginal discharge Patient with presence of vaginal discharge on examination and patient had cervicovaginal ancillary testing.  She will be notified of the results and if any further treatment is needed based on the results. - Cervicovaginal ancillary only  An After Visit Summary was printed and given to the  patient.  Follow-up: Return in about 6 months (around 04/14/2020) for asthma; and yearly well exam.    Cain Saupe, MD

## 2019-10-17 LAB — CYTOLOGY - PAP
Comment: NEGATIVE
Diagnosis: NEGATIVE
High risk HPV: NEGATIVE

## 2019-10-18 NOTE — Progress Notes (Signed)
Normal lab letter mailed.

## 2019-10-19 LAB — CERVICOVAGINAL ANCILLARY ONLY
Bacterial Vaginitis (gardnerella): NEGATIVE
Candida Glabrata: NEGATIVE
Candida Vaginitis: NEGATIVE
Chlamydia: NEGATIVE
Comment: NEGATIVE
Comment: NEGATIVE
Comment: NEGATIVE
Comment: NEGATIVE
Comment: NEGATIVE
Comment: NORMAL
Neisseria Gonorrhea: NEGATIVE
Trichomonas: NEGATIVE

## 2019-10-24 ENCOUNTER — Telehealth: Payer: Self-pay | Admitting: Family Medicine

## 2019-10-24 MED ORDER — ALBUTEROL SULFATE HFA 108 (90 BASE) MCG/ACT IN AERS: INHALATION_SPRAY | RESPIRATORY_TRACT | 2 refills | Status: DC

## 2019-10-24 NOTE — Telephone Encounter (Signed)
1) Medication(s) Requested (by name): albuterol (VENTOLIN HFA) 108 (90 Base) MCG/ACT inhaler [[id number]]   2) Pharmacy of Choice:  [id number] - [place], [address]   Approved medications will be sent to pharmacy, we will reach out to you if there is an issue.  Requests made after 3pm may not be addressed until following business day!

## 2019-10-24 NOTE — Telephone Encounter (Signed)
Refill sent.

## 2020-01-15 ENCOUNTER — Other Ambulatory Visit: Payer: Self-pay | Admitting: Family Medicine

## 2020-04-24 ENCOUNTER — Other Ambulatory Visit: Payer: Self-pay

## 2020-04-24 MED ORDER — ALBUTEROL SULFATE (2.5 MG/3ML) 0.083% IN NEBU: 2.5000 mg | INHALATION_SOLUTION | Freq: Four times a day (QID) | RESPIRATORY_TRACT | 0 refills | Status: DC | PRN

## 2020-04-24 MED ORDER — BUDESONIDE-FORMOTEROL FUMARATE 160-4.5 MCG/ACT IN AERO: INHALATION_SPRAY | RESPIRATORY_TRACT | 0 refills | Status: DC

## 2020-04-24 MED ORDER — ALBUTEROL SULFATE HFA 108 (90 BASE) MCG/ACT IN AERS: INHALATION_SPRAY | RESPIRATORY_TRACT | 0 refills | Status: DC

## 2020-05-27 ENCOUNTER — Ambulatory Visit: Payer: Managed Care, Other (non HMO) | Admitting: Internal Medicine

## 2020-05-27 ENCOUNTER — Telehealth: Payer: Self-pay

## 2020-05-27 NOTE — Telephone Encounter (Signed)
Called patient to do their pre-visit COVID screening.  Have you tested positive for COVID or are you currently waiting for COVID test results? no  Have you recently traveled internationally([place], [place], [place], [place], [place]) or within the [place] to a hotspot area([place], [place], [place], [place], [place])? no  Are you currently experiencing any of the following symptoms: fever, cough, SHOB, fatigue, body aches, loss of smell/taste, rash, diarrhea, vomiting, severe headaches, weakness, sore throat? no  Have you been in contact with anyone who has recently travelled? no  Have you been in contact with anyone who is experiencing any of the above symptoms or been diagnosed with COVID  or works in or has recently visited a SNF? No  Asked patient to come fasting for CPE labs.  

## 2020-05-27 NOTE — Patient Instructions (Addendum)
[email or web address]   Thank you for choosing Primary Care at [facility] to be your medical home!    [name] was seen by Melina Schools, DO today.   [name]'s primary care provider is Phill Myron, DO.   For the best care possible, you should try to see Phill Myron, DO whenever you come to the clinic.   We look forward to seeing you again soon!  If you have any questions about your visit today, please call us at [phone] or feel free to reach your primary care provider via [place].   Keeping You Healthy  Get These Tests 1. Blood Pressure- Have your blood pressure checked once a year by your health care provider.  Normal blood pressure is 120/80. 2. Weight- Have your body mass index (BMI) calculated to screen for obesity.  BMI is measure of body fat based on height and weight.  You can also calculate your own BMI at GravelBags.it. 3. Cholesterol- Have your cholesterol checked every 5 years starting at age 41 then yearly starting at age 75. 11. Chlamydia, HIV, and other sexually transmitted diseases- Get screened every year until age 51, then within three months of each new sexual provider. 5. Pap Test - Every 1-5 years; discuss with your health care provider. 6. Mammogram- Every 1-2 years starting at age 88--50  Take these medicines  Calcium with Vitamin D-Your body needs 1200 mg of Calcium each day and [phone] IU of Vitamin D daily.  Your body can only absorb 500 mg of Calcium at a time so Calcium must be taken in 2 or 3 divided doses throughout the day.  Multivitamin with folic acid- Once daily if it is possible for you to become pregnant.  Get these Immunizations  Gardasil-Series of three doses; prevents HPV related illness such as genital warts and cervical cancer.  Menactra-Single dose; prevents meningitis.  Tetanus shot- Every 10 years.  Flu shot-Every year.  Take these  steps 1. Do not smoke-Your healthcare provider can help you quit.  For tips on how to quit go to www.smokefree.gov or call 1-800 QUITNOW. 2. Be physically active- Exercise 5 days a week for at least 30 minutes.  If you are not already physically active, start slow and gradually work up to 30 minutes of moderate physical activity.  Examples of moderate activity include walking briskly, dancing, swimming, bicycling, etc. 3. Breast Cancer- A self breast exam every month is important for early detection of breast cancer.  For more information and instruction on self breast exams, ask your healthcare provider or https://www.patel.info/. 4. Eat a healthy diet- Eat a variety of healthy foods such as fruits, vegetables, whole grains, low fat milk, low fat cheeses, yogurt, lean meats, poultry and fish, beans, nuts, tofu, etc.  For more information go to www. Thenutritionsource.org 5. Drink alcohol in moderation- Limit alcohol intake to one drink or less per day. Never drink and drive. 6. Depression- Your emotional health is as important as your physical health.  If you're feeling down or losing interest in things you normally enjoy please talk to your healthcare provider about being screened for depression. 7. Dental visit- Brush and floss your teeth twice daily; visit your dentist twice a year. 8. Eye doctor- Get an eye exam at least every 2 years. 9. Helmet use- Always wear a helmet when riding a bicycle, motorcycle, rollerblading or skateboarding. 1. Safe sex- If you may be exposed to sexually transmitted infections, use a condom. 11. Seat belts- Seat belts can  save your live; always wear one. 12. Smoke/Carbon Monoxide detectors- These detectors need to be installed on the appropriate level of your home. Replace batteries at least once a year. 13. Skin cancer- When out in the sun please cover up and use sunscreen 15 SPF or higher. 14. Violence- If anyone is threatening or hurting you,  please tell your healthcare provider.

## 2020-05-28 ENCOUNTER — Encounter: Payer: Self-pay | Admitting: Internal Medicine

## 2020-05-28 ENCOUNTER — Other Ambulatory Visit: Payer: Self-pay

## 2020-05-28 ENCOUNTER — Ambulatory Visit (INDEPENDENT_AMBULATORY_CARE_PROVIDER_SITE_OTHER): Payer: Managed Care, Other (non HMO) | Admitting: Internal Medicine

## 2020-05-28 VITALS — BP 117/77 | HR 92 | Temp 97.2°F | Resp 17 | Ht 65.0 in | Wt 187.0 lb

## 2020-05-28 DIAGNOSIS — Z0000 Encounter for general adult medical examination without abnormal findings: Secondary | ICD-10-CM

## 2020-05-28 DIAGNOSIS — Z1159 Encounter for screening for other viral diseases: Secondary | ICD-10-CM | POA: Diagnosis not present

## 2020-05-28 DIAGNOSIS — J3489 Other specified disorders of nose and nasal sinuses: Secondary | ICD-10-CM | POA: Diagnosis not present

## 2020-05-28 NOTE — Progress Notes (Signed)
Subjective:    [name] - 38 y.o. female MRN [id number]  Date of birth: 05/15/1981  HPI  [name] is here for annual exam. She has concerns about chronic sinus pressure, see below. She declines STD testing. Her wife is currently [redacted] weeks pregnant. Patient had egg retrieval done and wife is carrying the fetus. She did not receive COVID vaccination.   Requests referral to ENT. Reports that over 2 years ago she started to have facial pressure, nasal congestion that was related to seasons. Now symptoms seem to be constant. Reports that has to do a lot of mouth breathing due to blockage of sinuses, this seems to occur more when she is laying flat on her stomach particularly during a massage. Takes oral antihistamine and elderberry syrup. She uses nasal saline spray occasionally. Wants to avoid nasal steroid spray if possible. Has had to take antibiotics for sinus infections in the past. Typically about once per year but says stopped clearing things up the way it used to.      Health Maintenance:  Health Maintenance Due  Topic Date Due  . Hepatitis C Screening  Never done  . COVID-19 Vaccine (1) Never done    -  reports that she has never smoked. She has never used smokeless tobacco. - Review of Systems: Per HPI. - Past Medical History: Patient Active Problem List   Diagnosis Date Noted  . Asthma 05/24/2019  . Environmental allergies 04/24/2018   - Medications: reviewed and updated   Objective:   Physical Exam BP 117/77   Pulse 92   Temp (!) 97.2 F (36.2 C) (Temporal)   Resp 17   Ht 5\' 5"  (1.651 m)   Wt 187 lb (84.8 kg)   SpO2 96%   BMI 31.12 kg/m  Physical Exam  Constitutional: She is oriented to person, place, and time and well-developed, well-nourished, and in no distress.  HENT:  Head: Normocephalic and atraumatic.  Mouth/Throat: Oropharynx is clear and moist.  TMs normal bilaterally. Nasal turbinates appear mildly edematous bilaterally. Do not  appreciate significantly deviated nasal septum.   Eyes: Pupils are equal, round, and reactive to light. Conjunctivae and EOM are normal.  Neck: No thyromegaly present.  Cardiovascular: Normal rate, regular rhythm, normal heart sounds and intact distal pulses.  No murmur heard. Pulmonary/Chest: Effort normal and breath sounds normal. No respiratory distress. She has no wheezes.  Abdominal: Soft. Bowel sounds are normal. She exhibits no distension. There is no abdominal tenderness. There is no rebound and no guarding.  Musculoskeletal:        General: No deformity or edema. Normal range of motion.     Cervical back: Normal range of motion and neck supple.  Lymphadenopathy:    She has no cervical adenopathy.  Neurological: She is alert and oriented to person, place, and time. Gait normal. Abnormal coordination: .diagmed.  Skin: Skin is warm and dry. No rash noted. She is not diaphoretic.  Psychiatric: Mood, affect and judgment normal.           Assessment & Plan:   1. Annual physical exam Counseled on 150 minutes of exercise per week, healthy eating (including decreased daily intake of saturated fats, cholesterol, added sugars, sodium), STI prevention, routine healthcare maintenance. - CBC with Differential - Comprehensive metabolic panel - Lipid Panel  2. Need for hepatitis C screening test - Hepatitis C Antibody  3. Sinus pressure Chronic. Does not appear to have an acute sinusitis warranting antibiotics at present. Exam shows some mild  inflammation of nasal turbinates. She wishes to avoid steroid use, even if limited to local use, if possible. Will refer to ENT for further evaluation and management.  - Ambulatory referral to ENT     Marcy Siren, D.O. 05/28/2020, 9:54 AM Primary Care at [facility]

## 2020-05-29 LAB — CBC WITH DIFFERENTIAL/PLATELET
Basophils Absolute: 0 10*3/uL (ref 0.0–0.2)
Basos: 1 %
EOS (ABSOLUTE): 0.7 10*3/uL — ABNORMAL HIGH (ref 0.0–0.4)
Eos: 8 %
Hematocrit: 44.4 % (ref 34.0–46.6)
Hemoglobin: 14.6 g/dL (ref 11.1–15.9)
Immature Grans (Abs): 0 10*3/uL (ref 0.0–0.1)
Immature Granulocytes: 0 %
Lymphocytes Absolute: 2.2 10*3/uL (ref 0.7–3.1)
Lymphs: 27 %
MCH: 31.4 pg (ref 26.6–33.0)
MCHC: 32.9 g/dL (ref 31.5–35.7)
MCV: 96 fL (ref 79–97)
Monocytes Absolute: 0.5 10*3/uL (ref 0.1–0.9)
Monocytes: 6 %
Neutrophils Absolute: 4.6 10*3/uL (ref 1.4–7.0)
Neutrophils: 58 %
Platelets: 395 10*3/uL (ref 150–450)
RBC: 4.65 x10E6/uL (ref 3.77–5.28)
RDW: 12.8 % (ref 11.7–15.4)
WBC: 8 10*3/uL (ref 3.4–10.8)

## 2020-05-29 LAB — COMPREHENSIVE METABOLIC PANEL
ALT: 26 IU/L (ref 0–32)
AST: 22 IU/L (ref 0–40)
Albumin/Globulin Ratio: 1.4 (ref 1.2–2.2)
Albumin: 4.2 g/dL (ref 3.8–4.8)
Alkaline Phosphatase: 51 IU/L (ref 48–121)
BUN/Creatinine Ratio: 16 (ref 9–23)
BUN: 15 mg/dL (ref 6–20)
Bilirubin Total: 0.4 mg/dL (ref 0.0–1.2)
CO2: 18 mmol/L — ABNORMAL LOW (ref 20–29)
Calcium: 9.9 mg/dL (ref 8.7–10.2)
Chloride: 106 mmol/L (ref 96–106)
Creatinine, Ser: 0.91 mg/dL (ref 0.57–1.00)
GFR calc Af Amer: 93 mL/min/{1.73_m2} (ref >59)
GFR calc non Af Amer: 81 mL/min/{1.73_m2} (ref >59)
Globulin, Total: 2.9 g/dL (ref 1.5–4.5)
Glucose: 100 mg/dL — ABNORMAL HIGH (ref 65–99)
Potassium: 4.9 mmol/L (ref 3.5–5.2)
Sodium: 143 mmol/L (ref 134–144)
Total Protein: 7.1 g/dL (ref 6.0–8.5)

## 2020-05-29 LAB — LIPID PANEL
Chol/HDL Ratio: 3 ratio (ref 0.0–4.4)
Cholesterol, Total: 190 mg/dL (ref 100–199)
HDL: 64 mg/dL (ref >39)
LDL Chol Calc (NIH): 112 mg/dL — ABNORMAL HIGH (ref 0–99)
Triglycerides: 75 mg/dL (ref 0–149)
VLDL Cholesterol Cal: 14 mg/dL (ref 5–40)

## 2020-05-29 LAB — HEPATITIS C ANTIBODY: Hep C Virus Ab: <0.1 s/co ratio (ref 0.0–0.9)

## 2020-05-29 NOTE — Progress Notes (Signed)
Patient notified of results & recommendations via MyChart per request.

## 2020-06-09 ENCOUNTER — Telehealth: Payer: Self-pay | Admitting: Internal Medicine

## 2020-06-09 MED ORDER — ALBUTEROL SULFATE HFA 108 (90 BASE) MCG/ACT IN AERS: INHALATION_SPRAY | RESPIRATORY_TRACT | 1 refills | Status: DC

## 2020-06-09 NOTE — Telephone Encounter (Signed)
1) Medication(s) Requested (by name):albuterol (VENTOLIN HFA) 108 (90 Base) MCG/ACT inhaler [[id number]]    2) Pharmacy [id number] - [place], [place] - [address]  [address] [place] [address]   3) Special Requests: wants a 3 month supply    Approved medications will be sent to the pharmacy, we will reach out if there is an issue.  Requests made after 3pm may not be addressed until the following business day!  If a patient is unsure of the name of the medication(s) please note and ask patient to call back when they are able to provide all info, do not send to responsible party until all information is available!

## 2020-11-25 ENCOUNTER — Telehealth: Payer: Self-pay

## 2020-11-25 MED ORDER — BUDESONIDE-FORMOTEROL FUMARATE 160-4.5 MCG/ACT IN AERO: INHALATION_SPRAY | RESPIRATORY_TRACT | 2 refills | Status: DC

## 2020-11-25 NOTE — Telephone Encounter (Signed)
1) Medication(s) Requested (by name):budesonide-formoterol (SYMBICORT) 160-4.5 MCG/ACT inhaler [[id number]]    2) Pharmacy of Choice:[id number] [place], [place] - [address]  [address] [place] [address]    3) Special Requests:   Approved medications will be sent to the pharmacy, we will reach out if there is an issue.  Requests made after 3pm may not be addressed until the following business day!  If a patient is unsure of the name of the medication(s) please note and ask patient to call back when they are able to provide all info, do not send to responsible party until all information is available!

## 2021-03-22 ENCOUNTER — Other Ambulatory Visit: Payer: Self-pay | Admitting: Internal Medicine

## 2021-05-28 ENCOUNTER — Ambulatory Visit
Admission: EM | Admit: 2021-05-28 | Discharge: 2021-05-28 | Disposition: A | Discharge status: Home / Self Care | Payer: 59 | Attending: Family Medicine | Admitting: Family Medicine

## 2021-05-28 DIAGNOSIS — J0100 Acute maxillary sinusitis, unspecified: Secondary | ICD-10-CM | POA: Diagnosis not present

## 2021-05-28 DIAGNOSIS — J3089 Other allergic rhinitis: Secondary | ICD-10-CM | POA: Diagnosis not present

## 2021-05-28 DIAGNOSIS — H6983 Other specified disorders of Eustachian tube, bilateral: Secondary | ICD-10-CM | POA: Diagnosis not present

## 2021-05-28 MED ORDER — DOXYCYCLINE HYCLATE 100 MG PO TABS: 100.0000 mg | ORAL_TABLET | Freq: Two times a day (BID) | ORAL | 0 refills | Status: DC

## 2021-05-28 MED ORDER — FLUTICASONE PROPIONATE 50 MCG/ACT NA SUSP: 1.0000 | Freq: Two times a day (BID) | NASAL | 2 refills | Status: DC

## 2021-05-28 NOTE — ED Triage Notes (Signed)
Patient presents to Urgent Care with complaints of bilateral ear pressure x 3 days and nasal drainage since last week. Treating symptoms with otc meds.   Denies fever.

## 2021-06-01 NOTE — ED Provider Notes (Signed)
[facility]    CSN: [id number] Arrival date & time: 05/28/21  1846      History   Chief Complaint Chief Complaint  Patient presents with   Nasal Congestion   Ear Fullness    Right ear     HPI [name] is a 40 y.o. female.   Presenting today with 1 week history of runny nose, ear pressure b/l and now the last 2 days worsening sxs with sinus pain and pressure, malaise, congestion. Denies fever, chills, CP, SOB, abdominal pain, N/V/D. So far trying OTC cold medications with minimal benefit. Hx of seasonal allergies and asthma.    Past Medical History:  Diagnosis Date   Asthma     Patient Active Problem List   Diagnosis Date Noted   Asthma 05/24/2019   Environmental allergies 04/24/2018    Past Surgical History:  Procedure Laterality Date   WISDOM TOOTH EXTRACTION      OB History   No obstetric history on file.      Home Medications    Prior to Admission medications   Medication Sig Start Date End Date Taking? Authorizing Provider  doxycycline (VIBRA-TABS) 100 MG tablet Take 1 tablet (100 mg total) by mouth 2 (two) times daily. 05/28/21  Yes Particia Nearing, PA-C  fluticasone [facility]) 50 MCG/ACT nasal spray Place 1 spray into both nostrils 2 (two) times daily. 05/28/21  Yes Particia Nearing, PA-C  albuterol (PROVENTIL) (2.5 MG/3ML) 0.083% nebulizer solution Take 3 mLs (2.5 mg total) by nebulization every 6 (six) hours as needed for wheezing or shortness of breath. 04/24/20   Arvilla Market, DO  albuterol (VENTOLIN HFA) 108 (90 Base) MCG/ACT inhaler INHALE 2 PUFFS BY MOUTH EVERY 4 HOURS AS NEEDED 03/23/21   Arvilla Market, DO  budesonide-formoterol [facility]) 160-4.5 MCG/ACT inhaler TAKE 1 PUFF BY MOUTH EVERY DAY 11/25/20   Arvilla Market, DO  [facility] 1/35 1-35 MG-MCG tablet Take 1 tablet by mouth daily. 12/15/19   [provider]  loratadine (CLARITIN) 10 MG tablet Take 10 mg by mouth daily.     [provider]    Family History Family History  Problem Relation Age of Onset   Healthy Mother    Healthy Father    Stroke Neg Hx    Heart attack Neg Hx     Social History Social History   Tobacco Use   Smoking status: Never   Smokeless tobacco: Never  Substance Use Topics   Alcohol use: Yes    Comment: occ   Drug use: No     Allergies   Benadryl [diphenhydramine], Eggs or egg-derived products, and Penicillins   Review of Systems Review of Systems PER HPI    Physical Exam Triage Vital Signs ED Triage Vitals  Enc Vitals Group     BP 05/28/21 1943 107/63     Pulse Rate 05/28/21 1943 (!) 101     Resp 05/28/21 1943 16     Temp 05/28/21 1943 98.3 F (36.8 C)     Temp Source 05/28/21 1943 Oral     SpO2 05/28/21 1943 97 %     Weight --      Height --      Head Circumference --      Peak Flow --      Pain Score 05/28/21 2010 0     Pain Loc --      Pain Edu? --      Excl. in GC? --  No data found.  Updated Vital Signs BP 107/63 (BP Location: Left Arm)   Pulse (!) 101   Temp 98.3 F (36.8 C) (Oral)   Resp 16   LMP 05/21/2021   SpO2 97%   Visual Acuity Right Eye Distance:   Left Eye Distance:   Bilateral Distance:    Right Eye Near:   Left Eye Near:    Bilateral Near:     Physical Exam Vitals and nursing note reviewed.  Constitutional:      Appearance: Normal appearance. She is not ill-appearing.  HENT:     Head: Atraumatic.     Ears:     Comments: B/l middle ear effusion    Nose: Congestion present.     Mouth/Throat:     Mouth: Mucous membranes are moist.     Pharynx: Posterior oropharyngeal erythema present.  Eyes:     Extraocular Movements: Extraocular movements intact.     Conjunctiva/sclera: Conjunctivae normal.  Cardiovascular:     Rate and Rhythm: Normal rate and regular rhythm.     Heart sounds: Normal heart sounds.  Pulmonary:     Effort: Pulmonary effort is normal.     Breath sounds: Normal breath sounds. No  wheezing or rales.  Abdominal:     General: Bowel sounds are normal. There is no distension.     Palpations: Abdomen is soft.     Tenderness: There is no abdominal tenderness. There is no guarding.  Musculoskeletal:        General: Normal range of motion.     Cervical back: Normal range of motion and neck supple.  Skin:    General: Skin is warm and dry.  Neurological:     Mental Status: She is alert and oriented to person, place, and time.  Psychiatric:        Mood and Affect: Mood normal.        Thought Content: Thought content normal.        Judgment: Judgment normal.     UC Treatments / Results  Labs (all labs ordered are listed, but only abnormal results are displayed) Labs Reviewed - No data to display  EKG   Radiology No results found.  Procedures Procedures (including critical care time)  Medications Ordered in UC Medications - No data to display  Initial Impression / Assessment and Plan / UC Course  I have reviewed the triage vital signs and the nursing notes.  Pertinent labs & imaging results that were available during my care of the patient were reviewed by me and considered in my medical decision making (see chart for details).     Suspect poorly controlled allergic rhinitis leading to eustachian tube dysfunction and sinusitis. Treat with doxycycline, flonase, zyrtec and supportive home care. Return for acutely worsening sxs.   Final Clinical Impressions(s) / UC Diagnoses   Final diagnoses:  Acute non-recurrent maxillary sinusitis  Seasonal allergic rhinitis due to other allergic trigger  Eustachian tube dysfunction, bilateral   Discharge Instructions   None    ED Prescriptions     Medication Sig Dispense Auth. Provider   fluticasone (FLONASE) 50 MCG/ACT nasal spray Place 1 spray into both nostrils 2 (two) times daily. 16 g Particia Nearing, [place]   doxycycline (VIBRA-TABS) 100 MG tablet Take 1 tablet (100 mg total) by mouth 2 (two) times  daily. 14 tablet Particia Nearing, [place]      PDMP not reviewed this encounter.   Particia Nearing, [place] 06/01/21 0700

## 2021-10-12 ENCOUNTER — Other Ambulatory Visit: Payer: Self-pay | Admitting: Internal Medicine

## 2021-11-28 ENCOUNTER — Other Ambulatory Visit: Payer: Self-pay | Admitting: Internal Medicine

## 2021-12-26 ENCOUNTER — Other Ambulatory Visit: Payer: Self-pay | Admitting: Internal Medicine

## 2021-12-28 ENCOUNTER — Other Ambulatory Visit: Payer: Self-pay

## 2021-12-28 DIAGNOSIS — J45909 Unspecified asthma, uncomplicated: Secondary | ICD-10-CM

## 2021-12-28 MED ORDER — ALBUTEROL SULFATE HFA 108 (90 BASE) MCG/ACT IN AERS: 2.0000 | INHALATION_SPRAY | RESPIRATORY_TRACT | 5 refills | Status: DC | PRN

## 2022-01-20 ENCOUNTER — Telehealth: Payer: Self-pay | Admitting: Internal Medicine

## 2022-01-20 NOTE — Telephone Encounter (Signed)
budesonide-formoterol (SYMBICORT) 160-4.5 MCG/ACT inhaler [[id number]]    albuterol (VENTOLIN HFA) 108 (90 Base) MCG/ACT inhaler [[id number]]  Pharmacy  [facility] [address] [place] - [address]  [address] [place]  [address]  Phone:  [phone]  Fax:  [phone]

## 2022-01-28 ENCOUNTER — Other Ambulatory Visit: Payer: Self-pay | Admitting: Internal Medicine

## 2022-02-01 ENCOUNTER — Telehealth: Payer: Self-pay | Admitting: Internal Medicine

## 2022-02-01 NOTE — Telephone Encounter (Signed)
° °  Pt states she really needs her SYMBICORT budesonide-formoterol (SYMBICORT) 160-4.5 MCG/ACT inhaler [[id number]]   AND ALSO albuterol (VENTOLIN HFA) 108 (90 Base) MCG/ACT inhaler [[id number]]  Pharmacy  [id number] - [place], [place] - [address]  [address] [place]  [address]  Phone:  [phone]  Fax:  [phone]  DEA #:  [id number]

## 2022-02-05 ENCOUNTER — Other Ambulatory Visit: Payer: Self-pay

## 2022-02-05 ENCOUNTER — Ambulatory Visit (INDEPENDENT_AMBULATORY_CARE_PROVIDER_SITE_OTHER): Payer: 59 | Admitting: Nurse Practitioner

## 2022-02-05 ENCOUNTER — Encounter: Payer: Self-pay | Admitting: Nurse Practitioner

## 2022-02-05 DIAGNOSIS — J4540 Moderate persistent asthma, uncomplicated: Secondary | ICD-10-CM | POA: Diagnosis not present

## 2022-02-05 MED ORDER — BUDESONIDE-FORMOTEROL FUMARATE 160-4.5 MCG/ACT IN AERO: INHALATION_SPRAY | RESPIRATORY_TRACT | 2 refills | Status: DC

## 2022-02-05 NOTE — Patient Instructions (Addendum)
Moderate persistent asthma without complication:   Will refill Symbicort:  Meds ordered this encounter  Medications   budesonide-formoterol (SYMBICORT) 160-4.5 MCG/ACT inhaler    Sig: TAKE 1 PUFF BY MOUTH EVERY DAY    Dispense:  10.2 g    Refill:  2   Continue all other medications  Stay active  Follow up:  Follow up for complete physical at Dr. [name] first available

## 2022-02-05 NOTE — Progress Notes (Signed)
Virtual Visit via Telephone Note  I connected with [name] on 02/05/22 at  9:20 AM EST by telephone and verified that I am speaking with the correct person using two identifiers.  Location: Patient: home Provider: office   I discussed the limitations, risks, security and privacy concerns of performing an evaluation and management service by telephone and the availability of in person appointments. I also discussed with the patient that there may be a patient responsible charge related to this service. The patient expressed understanding and agreed to proceed.   History of Present Illness:  Patient presents today for medication refills.  This was a previous patient of Dr. Earlene Plater.  Patient states that she has been out of her Symbicort for the past month.  She states that she has been using her albuterol inhaler more frequent lately.  She is requesting refills on both the Symbicort and albuterol.  Advised patient that albuterol has already been refilled by Dr. Andrey Campanile.  I will send in a refill for Symbicort today.  Patient is due for a complete physical and we will get this scheduled for her with Dr. Andrey Campanile.  Overall patient is doing well other than needing her inhalers refilled. Denies f/c/s, n/v/d, hemoptysis, PND, chest pain or edema.     Observations/Objective:  Vitals with BMI 05/28/2021 05/28/2020 10/15/2019  Height - 5\' 5"  -  Weight - 187 lbs 188 lbs 13 oz  BMI - 31.12 -  Systolic 107 117  Diastolic 63 77 83  Pulse 101 92 92      Assessment and Plan:  Asthma Will refill Symbicort:  Meds ordered this encounter  Medications   budesonide-formoterol (SYMBICORT) 160-4.5 MCG/ACT inhaler    Sig: TAKE 1 PUFF BY MOUTH EVERY DAY    Dispense:  10.2 g    Refill:  2   Continue all other medications  Stay active  Follow up:  Follow up for complete physical at Dr. 242 first available  Patient Instructions  Moderate persistent asthma without  complication:   Will refill Symbicort:  Meds ordered this encounter  Medications   budesonide-formoterol (SYMBICORT) 160-4.5 MCG/ACT inhaler    Sig: TAKE 1 PUFF BY MOUTH EVERY DAY    Dispense:  10.2 g    Refill:  2   Continue all other medications  Stay active  Follow up:  Follow up for complete physical at Dr. Tawana Graham first available     I discussed the assessment and treatment plan with the patient. The patient was provided an opportunity to ask questions and all were answered. The patient agreed with the plan and demonstrated an understanding of the instructions.   The patient was advised to call back or seek an in-person evaluation if the symptoms worsen or if the condition fails to improve as anticipated.  I provided 22 minutes of non-face-to-face time during this encounter.   Christina Scale, NP

## 2022-02-05 NOTE — Assessment & Plan Note (Signed)
Will refill Symbicort:  Meds ordered this encounter  Medications   budesonide-formoterol (SYMBICORT) 160-4.5 MCG/ACT inhaler    Sig: TAKE 1 PUFF BY MOUTH EVERY DAY    Dispense:  10.2 g    Refill:  2   Continue all other medications  Stay active  Follow up:  Follow up for complete physical at Dr. [name] first available

## 2022-03-31 ENCOUNTER — Encounter: Payer: 59 | Admitting: Family Medicine

## 2022-05-12 ENCOUNTER — Other Ambulatory Visit: Payer: Self-pay | Admitting: Family Medicine

## 2022-05-12 ENCOUNTER — Ambulatory Visit (INDEPENDENT_AMBULATORY_CARE_PROVIDER_SITE_OTHER): Payer: 59 | Admitting: Family Medicine

## 2022-05-12 VITALS — BP 111/76 | HR 91 | Temp 98.0°F | Resp 16 | Ht 63.0 in | Wt 208.8 lb

## 2022-05-12 DIAGNOSIS — J4540 Moderate persistent asthma, uncomplicated: Secondary | ICD-10-CM | POA: Diagnosis not present

## 2022-05-12 MED ORDER — ALBUTEROL SULFATE HFA 108 (90 BASE) MCG/ACT IN AERS: 2.0000 | INHALATION_SPRAY | RESPIRATORY_TRACT | 5 refills | Status: DC | PRN

## 2022-05-12 MED ORDER — ALBUTEROL SULFATE (2.5 MG/3ML) 0.083% IN NEBU: 2.5000 mg | INHALATION_SOLUTION | Freq: Four times a day (QID) | RESPIRATORY_TRACT | 0 refills | Status: DC | PRN

## 2022-05-12 MED ORDER — BUDESONIDE-FORMOTEROL FUMARATE 160-4.5 MCG/ACT IN AERO: INHALATION_SPRAY | RESPIRATORY_TRACT | 5 refills | Status: DC

## 2022-05-13 ENCOUNTER — Encounter: Payer: Self-pay | Admitting: Family Medicine

## 2022-05-13 NOTE — Progress Notes (Signed)
Established Patient Office Visit  Subjective    Patient ID: [name], female    DOB: August 10, 1981  Age: 41 y.o. MRN: [id number]  CC:  Chief Complaint  Patient presents with   Annual Exam    HPI [name] presents routine follow up of chronic asthma. Patient denies acute complaints or concerns.    Outpatient Encounter Medications as of 05/12/2022  Medication Sig   albuterol (PROVENTIL) (2.5 MG/3ML) 0.083% nebulizer solution Take 3 mLs (2.5 mg total) by nebulization every 6 (six) hours as needed for wheezing or shortness of breath.   albuterol (VENTOLIN HFA) 108 (90 Base) MCG/ACT inhaler Inhale 2 puffs into the lungs every 4 (four) hours as needed.   budesonide-formoterol (SYMBICORT) 160-4.5 MCG/ACT inhaler TAKE 1 PUFF BY MOUTH EVERY DAY   fluticasone (FLONASE) 50 MCG/ACT nasal spray Place 1 spray into both nostrils 2 (two) times daily.   [facility] 1/35 1-35 MG-MCG tablet Take 1 tablet by mouth daily.   loratadine (CLARITIN) 10 MG tablet Take 10 mg by mouth daily.   [DISCONTINUED] albuterol (PROVENTIL) (2.5 MG/3ML) 0.083% nebulizer solution Take 3 mLs (2.5 mg total) by nebulization every 6 (six) hours as needed for wheezing or shortness of breath.   [DISCONTINUED] albuterol (VENTOLIN HFA) 108 (90 Base) MCG/ACT inhaler Inhale 2 puffs into the lungs every 4 (four) hours as needed.   [DISCONTINUED] budesonide-formoterol (SYMBICORT) 160-4.5 MCG/ACT inhaler TAKE 1 PUFF BY MOUTH EVERY DAY   [DISCONTINUED] doxycycline (VIBRA-TABS) 100 MG tablet Take 1 tablet (100 mg total) by mouth 2 (two) times daily.   No facility-administered encounter medications on file as of 05/12/2022.    Past Medical History:  Diagnosis Date   Asthma     Past Surgical History:  Procedure Laterality Date   WISDOM TOOTH EXTRACTION      Family History  Problem Relation Age of Onset   Healthy Mother    Healthy Father    Stroke Neg Hx    Heart attack Neg Hx     Social History    Socioeconomic History   Marital status: Single    Spouse name: Not on file   Number of children: Not on file   Years of education: Not on file   Highest education level: Not on file  Occupational History   Not on file  Tobacco Use   Smoking status: Never   Smokeless tobacco: Never  Substance and Sexual Activity   Alcohol use: Yes    Comment: occ   Drug use: No   Sexual activity: Not on file  Other Topics Concern   Not on file  Social History Narrative   Not on file   Social Determinants of Health   Financial Resource Strain: Not on file  Food Insecurity: Not on file  Transportation Needs: Not on file  Physical Activity: Not on file  Stress: Not on file  Social Connections: Not on file  Intimate Partner Violence: Not on file    Review of Systems  Respiratory:  Negative for cough, shortness of breath and wheezing.   All other systems reviewed and are negative.      Objective    BP 111/76   Pulse 91   Temp 98 F (36.7 C) (Oral)   Resp 16   Ht 5\' 3"  (1.6 m)   Wt 208 lb 12.8 oz (94.7 kg)   PF 98 L/min   BMI 36.99 kg/m   Physical Exam Vitals and nursing note reviewed.  Constitutional:  General: She is not in acute distress. Cardiovascular:     Rate and Rhythm: Normal rate and regular rhythm.  Pulmonary:     Effort: Pulmonary effort is normal.     Breath sounds: Normal breath sounds. No wheezing.  Abdominal:     Palpations: Abdomen is soft.     Tenderness: There is no abdominal tenderness.  Neurological:     General: No focal deficit present.     Mental Status: She is alert and oriented to person, place, and time.        Assessment & Plan:   1. Moderate persistent asthma without complication Appears stable with present management. Meds refilled. Continue      No follow-ups on file.   Becky Sax, MD

## 2022-05-18 ENCOUNTER — Other Ambulatory Visit: Payer: Self-pay | Admitting: Family Medicine

## 2022-11-03 ENCOUNTER — Ambulatory Visit
Admission: EM | Admit: 2022-11-03 | Discharge: 2022-11-03 | Disposition: A | Discharge status: Home / Self Care | Payer: 59 | Attending: Physician Assistant | Admitting: Physician Assistant

## 2022-11-03 DIAGNOSIS — Z1152 Encounter for screening for COVID-19: Secondary | ICD-10-CM | POA: Insufficient documentation

## 2022-11-03 DIAGNOSIS — B349 Viral infection, unspecified: Secondary | ICD-10-CM | POA: Diagnosis not present

## 2022-11-03 LAB — RESP PANEL BY RT-PCR (RSV, FLU A&B, COVID)  RVPGX2
Influenza A by PCR: NEGATIVE
Influenza B by PCR: NEGATIVE
Resp Syncytial Virus by PCR: NEGATIVE
SARS Coronavirus 2 by RT PCR: NEGATIVE

## 2022-11-03 LAB — POCT RAPID STREP A (OFFICE): Rapid Strep A Screen: NEGATIVE

## 2022-11-03 NOTE — ED Triage Notes (Signed)
Pt presents with nausea, headache, chills, and generalized body aches since last night.

## 2022-11-03 NOTE — ED Provider Notes (Signed)
[facility]    CSN: [id number] Arrival date & time: 11/03/22  0943      History   Chief Complaint Chief Complaint  Patient presents with   Nausea   Headache   Chills   Generalized Body Aches    HPI Breiona Graham is a 41 y.o. female.   Patient here today for evaluation of nausea, headache, chills and body aches that started last night.  She reports she has also had some postnasal drip and ear irritation.  She has not had any diarrhea but does report some fever.  She states at home her temperature was as high as 100.4 Fahrenheit.  She tried drinking warm tea which was helpful. She would like screening for strep and other viral illness. She states her wife was diagnosed with RSV without testing, but states that she has not had cough like her wife has.   The history is provided by the patient.  Headache Associated symptoms: congestion, myalgias, nausea and sore throat   Associated symptoms: no abdominal pain, no cough, no diarrhea, no ear pain, no fever and no vomiting     Past Medical History:  Diagnosis Date   Asthma     Patient Active Problem List   Diagnosis Date Noted   Asthma 05/24/2019   Environmental allergies 04/24/2018    Past Surgical History:  Procedure Laterality Date   WISDOM TOOTH EXTRACTION      OB History   No obstetric history on file.      Home Medications    Prior to Admission medications   Medication Sig Start Date End Date Taking? Authorizing Provider  albuterol (VENTOLIN HFA) 108 (90 Base) MCG/ACT inhaler Inhale 2 puffs into the lungs every 4 (four) hours as needed. 05/12/22   Dorna Mai, MD  budesonide-formoterol [facility]) 160-4.5 MCG/ACT inhaler TAKE 1 PUFF BY MOUTH EVERY DAY 05/12/22   Dorna Mai, MD  fluticasone [facility]) 50 MCG/ACT nasal spray Place 1 spray into both nostrils 2 (two) times daily. 05/28/21   Volney American, PA-C  [facility] 1/35 1-35 MG-MCG tablet Take 1 tablet by mouth daily. 12/15/19    [provider]  levalbuterol [facility]) 1.25 MG/3ML nebulizer solution Please specify directions, refills and quantity 05/21/22   Dorna Mai, MD  loratadine (CLARITIN) 10 MG tablet Take 10 mg by mouth daily.    [provider]    Family History Family History  Problem Relation Age of Onset   Healthy Mother    Healthy Father    Stroke Neg Hx    Heart attack Neg Hx     Social History Social History   Tobacco Use   Smoking status: Never   Smokeless tobacco: Never  Substance Use Topics   Alcohol use: Yes    Comment: occ   Drug use: No     Allergies   Eggs or egg-derived products, Benadryl [diphenhydramine], Penicillin g, and Penicillins   Review of Systems Review of Systems  Constitutional:  Negative for chills and fever.  HENT:  Positive for congestion and sore throat. Negative for ear pain.   Eyes:  Negative for discharge and redness.  Respiratory:  Negative for cough, shortness of breath and wheezing.   Gastrointestinal:  Positive for nausea. Negative for abdominal pain, diarrhea and vomiting.  Musculoskeletal:  Positive for myalgias.  Neurological:  Positive for headaches.     Physical Exam Triage Vital Signs ED Triage Vitals  Enc Vitals Group     BP 11/03/22 1004 118/78  Pulse Rate 11/03/22 1004 (!) 103     Resp 11/03/22 1004 17     Temp 11/03/22 1004 98.6 F (37 C)     Temp Source 11/03/22 1004 Oral     SpO2 11/03/22 1004 98 %     Weight --      Height --      Head Circumference --      Peak Flow --      Pain Score 11/03/22 1007 5     Pain Loc --      Pain Edu? --      Excl. in GC? --    No data found.  Updated Vital Signs BP 118/78 (BP Location: Left Arm)   Pulse (!) 103   Temp 98.6 F (37 C) (Oral)   Resp 17   LMP 10/13/2022   SpO2 98%      Physical Exam Vitals and nursing note reviewed.  Constitutional:      General: She is not in acute distress.    Appearance: Normal appearance. She is not ill-appearing.   HENT:     Head: Normocephalic and atraumatic.     Right Ear: Tympanic membrane normal.     Left Ear: Tympanic membrane normal.     Nose: Congestion present.     Mouth/Throat:     Mouth: Mucous membranes are moist.     Pharynx: No oropharyngeal exudate or posterior oropharyngeal erythema.  Eyes:     Conjunctiva/sclera: Conjunctivae normal.  Cardiovascular:     Rate and Rhythm: Normal rate and regular rhythm.     Heart sounds: Normal heart sounds. No murmur heard. Pulmonary:     Effort: Pulmonary effort is normal. No respiratory distress.     Breath sounds: Normal breath sounds. No wheezing, rhonchi or rales.  Skin:    General: Skin is warm and dry.  Neurological:     Mental Status: She is alert.  Psychiatric:        Mood and Affect: Mood normal.        Thought Content: Thought content normal.      UC Treatments / Results  Labs (all labs ordered are listed, but only abnormal results are displayed) Labs Reviewed  RESP PANEL BY RT-PCR (RSV, FLU A&B, COVID)  [id number]  POCT RAPID STREP A (OFFICE)    EKG   Radiology No results found.  Procedures Procedures (including critical care time)  Medications Ordered in UC Medications - No data to display  Initial Impression / Assessment and Plan / UC Course  I have reviewed the triage vital signs and the nursing notes.  Pertinent labs & imaging results that were available during my care of the patient were reviewed by me and considered in my medical decision making (see chart for details).    Suspect viral etiology of symptoms. Will screen for covid, rsv and flu. Rapid strep screening negative. Encouraged symptomatic treatment and increased fluids with follow up with any further concerns.   Final Clinical Impressions(s) / UC Diagnoses   Final diagnoses:  Viral syndrome  Encounter for screening for COVID-19     Discharge Instructions       Strep test was negative.   Continue symptomatic treatment as needed.       ED Prescriptions   None    PDMP not reviewed this encounter.   Tomi Bamberger, PA-C 11/03/22 1132

## 2022-11-03 NOTE — Discharge Instructions (Signed)
  Strep test was negative.   Continue symptomatic treatment as needed.

## 2022-11-18 ENCOUNTER — Emergency Department (HOSPITAL_COMMUNITY): Payer: 59

## 2022-11-18 ENCOUNTER — Encounter (HOSPITAL_COMMUNITY): Payer: Self-pay

## 2022-11-18 ENCOUNTER — Other Ambulatory Visit: Payer: Self-pay

## 2022-11-18 ENCOUNTER — Emergency Department (HOSPITAL_COMMUNITY)
Admission: EM | Admit: 2022-11-18 | Discharge: 2022-11-19 | Disposition: A | Discharge status: Home / Self Care | Payer: 59 | Attending: Emergency Medicine | Admitting: Emergency Medicine

## 2022-11-18 DIAGNOSIS — D75839 Thrombocytosis, unspecified: Secondary | ICD-10-CM | POA: Insufficient documentation

## 2022-11-18 DIAGNOSIS — R0789 Other chest pain: Secondary | ICD-10-CM

## 2022-11-18 DIAGNOSIS — R42 Dizziness and giddiness: Secondary | ICD-10-CM | POA: Diagnosis not present

## 2022-11-18 DIAGNOSIS — R202 Paresthesia of skin: Secondary | ICD-10-CM | POA: Insufficient documentation

## 2022-11-18 DIAGNOSIS — D72829 Elevated white blood cell count, unspecified: Secondary | ICD-10-CM | POA: Insufficient documentation

## 2022-11-18 DIAGNOSIS — E119 Type 2 diabetes mellitus without complications: Secondary | ICD-10-CM | POA: Insufficient documentation

## 2022-11-18 NOTE — ED Triage Notes (Signed)
Pt reports with left arm pain numbness and dizziness since today/yesterday.

## 2022-11-18 NOTE — ED Provider Triage Note (Signed)
Emergency Medicine Provider Triage Evaluation Note  [name] , a 41 y.o. female  was evaluated in triage.  Patient presenting today with intermittent lightheadedness and chest fluttering.  Says that she started to have some discomfort in her left arm which also alarmed her.  She is concerned she may have had a "mini heart attack."  Medical history positive for asthma and hyperlipidemia.  No shortness of breath or PE risk factors.  Review of Systems  Positive:  Negative:   Physical Exam  BP (!) 140/84 (BP Location: Left Arm)   Pulse 83   Temp 97.9 F (36.6 C) (Oral)   Resp 16   Ht 5\' 3"  (1.6 m)   Wt 90.7 kg   LMP 10/13/2022   SpO2 97%   BMI 35.43 kg/m  Gen:   Awake, no distress   Resp:  Normal effort  MSK:   Moves extremities without difficulty  Other:  RRR, CTAB.  Normal strength in bilateral upper extremities.  Very anxious  Medical Decision Making  Medically screening exam initiated at 11:37 PM.  Appropriate orders placed.  Carliss Narine was informed that the remainder of the evaluation will be completed by another provider, this initial triage assessment does not replace that evaluation, and the importance of remaining in the ED until their evaluation is complete.     10/15/2022, Saddie Benders 11/18/22 2339

## 2022-11-19 LAB — CBC
HCT: 43 % (ref 36.0–46.0)
Hemoglobin: 13.8 g/dL (ref 12.0–15.0)
MCH: 30.3 pg (ref 26.0–34.0)
MCHC: 32.1 g/dL (ref 30.0–36.0)
MCV: 94.5 fL (ref 80.0–100.0)
Platelets: 440 10*3/uL — ABNORMAL HIGH (ref 150–400)
RBC: 4.55 MIL/uL (ref 3.87–5.11)
RDW: 12.6 % (ref 11.5–15.5)
WBC: 11.4 10*3/uL — ABNORMAL HIGH (ref 4.0–10.5)
nRBC: 0 % (ref 0.0–0.2)

## 2022-11-19 LAB — BASIC METABOLIC PANEL
Anion gap: 7 (ref 5–15)
BUN: 22 mg/dL — ABNORMAL HIGH (ref 6–20)
CO2: 23 mmol/L (ref 22–32)
Calcium: 9.6 mg/dL (ref 8.9–10.3)
Chloride: 106 mmol/L (ref 98–111)
Creatinine, Ser: 0.91 mg/dL (ref 0.44–1.00)
GFR, Estimated: >60 mL/min (ref >60)
Glucose, Bld: 114 mg/dL — ABNORMAL HIGH (ref 70–99)
Potassium: 3.6 mmol/L (ref 3.5–5.1)
Sodium: 136 mmol/L (ref 135–145)

## 2022-11-19 LAB — TROPONIN I (HIGH SENSITIVITY)
Troponin I (High Sensitivity): <2 ng/L (ref <18)
Troponin I (High Sensitivity): <2 ng/L (ref <18)

## 2022-11-19 NOTE — ED Provider Notes (Signed)
[facility] DEPT Provider Note   CSN: [id number] Arrival date & time: 11/18/22  2250     History  Chief Complaint  Patient presents with   Numbness   Dizziness    [name] is a 41 y.o. female.  HPI   Patient without significant medical history presents with complaints of lightheadedness and left arm paresthesias.  Patient states this started yesterday, states that she was laying down with her child and all of a sudden felt lightheaded, she sat up, and felt as if she had to take a gaps of air, she states that she had slight chest pain, she not become diaphoretic, no nausea or vomiting, states that lasted about a minute and then resolved.  She states later she started to have some left arm paresthesias, states that she has left upper shoulder pain and then it could be from that, she has no cardiac history no history PEs or DVTs currentl not on hormone therapy, not being treated for diabetes, hyperlipidemia, non-smoker.  Currently her symptoms have resolved.    Home Medications Prior to Admission medications   Medication Sig Start Date End Date Taking? Authorizing Provider  albuterol (VENTOLIN HFA) 108 (90 Base) MCG/ACT inhaler Inhale 2 puffs into the lungs every 4 (four) hours as needed. 05/12/22   Dorna Mai, MD  budesonide-formoterol [facility]) 160-4.5 MCG/ACT inhaler TAKE 1 PUFF BY MOUTH EVERY DAY 05/12/22   Dorna Mai, MD  fluticasone [facility]) 50 MCG/ACT nasal spray Place 1 spray into both nostrils 2 (two) times daily. 05/28/21   Volney American, PA-C  [facility] 1/35 1-35 MG-MCG tablet Take 1 tablet by mouth daily. 12/15/19   [provider]  levalbuterol Penne Lash) 1.25 MG/3ML nebulizer solution Please specify directions, refills and quantity 05/21/22   Dorna Mai, MD  loratadine (CLARITIN) 10 MG tablet Take 10 mg by mouth daily.    [provider]      Allergies    Eggs or egg-derived products, Benadryl  [diphenhydramine], Penicillin g, and Penicillins    Review of Systems   Review of Systems  Constitutional:  Negative for chills and fever.  Respiratory:  Negative for shortness of breath.   Cardiovascular:  Negative for chest pain.  Gastrointestinal:  Negative for abdominal pain.  Neurological:  Negative for headaches.    Physical Exam Updated Vital Signs BP (!) 141/95   Pulse 74   Temp 97.9 F (36.6 C) (Oral)   Resp 16   Ht 5\' 3"  (1.6 m)   Wt 90.7 kg   LMP 10/13/2022   SpO2 100%   BMI 35.43 kg/m  Physical Exam Vitals and nursing note reviewed.  Constitutional:      General: She is not in acute distress.    Appearance: She is not ill-appearing.  HENT:     Head: Normocephalic and atraumatic.     Nose: No congestion.  Eyes:     Conjunctiva/sclera: Conjunctivae normal.  Cardiovascular:     Rate and Rhythm: Normal rate and regular rhythm.     Pulses: Normal pulses.     Heart sounds: No murmur heard.    No friction rub. No gallop.  Pulmonary:     Effort: No respiratory distress.     Breath sounds: No wheezing, rhonchi or rales.  Musculoskeletal:     Comments: Patient shoulders palpated, she has a palpable knot on the dorsum anterior aspect of left shoulder, no fluctuant induration present no overlying skin changes.  Slightly tender during palpation.  No calf  tenderness no palpable cords  Skin:    General: Skin is warm and dry.  Neurological:     Mental Status: She is alert.     Comments: No facial asymmetry no difficulty with word finding following two-step commands there is no unilateral weakness present.  Psychiatric:        Mood and Affect: Mood normal.     ED Results / Procedures / Treatments   Labs (all labs ordered are listed, but only abnormal results are displayed) Labs Reviewed  BASIC METABOLIC PANEL - Abnormal; Notable for the following components:      Result Value   Glucose, Bld 114 (*)    BUN 22 (*)    All other components within normal limits   CBC - Abnormal; Notable for the following components:   WBC 11.4 (*)    Platelets 440 (*)    All other components within normal limits  TROPONIN I (HIGH SENSITIVITY)  TROPONIN I (HIGH SENSITIVITY)    EKG None  Radiology DG Chest 2 View  Result Date: 11/18/2022 CLINICAL DATA:  Mid chest pain. EXAM: CHEST - 2 VIEW COMPARISON:  September 17, 2008 FINDINGS: The heart size and mediastinal contours are within normal limits. A very small, stable area of chronic airspace disease is again seen within the right upper lobe. The visualized skeletal structures are unremarkable. IMPRESSION: No acute or active cardiopulmonary disease. Electronically Signed   By: Virgina Norfolk M.D.   On: 11/18/2022 23:56    Procedures Procedures    Medications Ordered in ED Medications - No data to display  ED Course/ Medical Decision Making/ A&P                           Medical Decision Making  This patient presents to the ED for concern of chest pain, this involves an extensive number of treatment options, and is a complaint that carries with it a high risk of complications and morbidity.  The differential diagnosis includes PE, ACS, pneumonia    Additional history obtained:  Additional history obtained from N/A External records from outside source obtained and reviewed including PCP notes   Co morbidities that complicate the patient evaluation  N/A  Social Determinants of Health:  N/A    Lab Tests:  I Ordered, and personally interpreted labs.  The pertinent results include: Slight leukocytosis with a white count 11.4, elevated platelets at 440, BMP shows glucose of 114, BUN 22, negative delta troponin   Imaging Studies ordered:  I ordered imaging studies including chest x-ray I independently visualized and interpreted imaging which showed negative I agree with the radiologist interpretation   Cardiac Monitoring:  The patient was maintained on a cardiac monitor.  I personally  viewed and interpreted the cardiac monitored which showed an underlying rhythm of: EKG without signs of ischemia   Medicines ordered and prescription drug management:  I ordered medication including N/A I have reviewed the patients home medicines and have made adjustments as needed  Critical Interventions:  N/A   Reevaluation:  Treated obtain lab work imaging which I personally reviewed they are unremarkable, she had a benign physical exam, agreement with discharge at this time.   Consultations Obtained:  N/A   Test Considered:  N/A    Rule out I have low suspicion for ACS as history is atypical, patient has no cardiac history, EKG was sinus rhythm without signs of ischemia, patient had negative delta troponin.  Low suspicion for  PE as patient denies pleuritic chest pain, shortness of breath, patient denies leg pain, no pedal edema noted on exam, patient was PERC negative.  Low suspicion for AAA or aortic dissection as history is atypical, patient has low risk factors.  Suspicion for CVA is low at this time there is no focal deficit on my exam, she does note some paresthesias in her left arm but I suspect this is secondary due to muscle tightness that she has a palpable knot in her left upper shoulder likely causing this.    Dispostion and problem list  After consideration of the diagnostic results and the patients response to treatment, I feel that the patent would benefit from discharge.  Atypical chest pain-unclear etiology, possible patient felt some heart palpitations which cause some lightheadedness, suspect this is from from increased stress as well as lack of sleep, will have her follow-up with PCP for further assessment. Left arm paresthesia-suspect this is secondary due to not noted on her left upper shoulder, recommend over-the-counter pain medications follow-up with her primary care doctor.            Final Clinical Impression(s) / ED Diagnoses Final  diagnoses:  Atypical chest pain    Rx / DC Orders ED Discharge Orders     None         Carroll Sage, PA-C 11/19/22 0558    Paula Libra, MD 11/19/22 [phone]

## 2022-11-19 NOTE — Discharge Instructions (Signed)
Lab work imaging was all reassuring.  I recommend follow-up with your primary care doctor for further evaluation.  Come back to the emergency department if you develop chest pain, shortness of breath, severe abdominal pain, uncontrolled nausea, vomiting, diarrhea.

## 2023-02-12 ENCOUNTER — Other Ambulatory Visit: Payer: Self-pay | Admitting: Family Medicine

## 2023-02-14 NOTE — Telephone Encounter (Signed)
Requested Prescriptions  Pending Prescriptions Disp Refills   albuterol (VENTOLIN HFA) 108 (90 Base) MCG/ACT inhaler [Pharmacy Med Name: ALBUTEROL HFA (PROAIR) INHALER] 8.5 each 5    Sig: TAKE 2 PUFFS BY MOUTH EVERY 4 HOURS AS NEEDED     Pulmonology:  Beta Agonists 2 Failed - 02/12/2023  3:43 AM      Failed - Last BP in normal range    BP Readings from Last 1 Encounters:  11/19/22 (!) 141/95         Passed - Last Heart Rate in normal range    Pulse Readings from Last 1 Encounters:  11/19/22 74         Passed - Valid encounter within last 12 months    Recent Outpatient Visits           9 months ago Moderate persistent asthma without complication   [facility], MD   1 year ago Moderate persistent asthma without complication   [facility], Kriste Basque, NP   2 years ago Annual physical exam   [facility], Bayard Beaver, MD   3 years ago Cervical cancer screening   [facility] [place], MD   3 years ago Annual physical exam   [facility], Carroll Sage, NP       Future Appointments             In 4 weeks Dorna Mai, MD [facility]

## 2023-03-15 ENCOUNTER — Telehealth (INDEPENDENT_AMBULATORY_CARE_PROVIDER_SITE_OTHER): Payer: 59 | Admitting: Family Medicine

## 2023-03-15 DIAGNOSIS — J4540 Moderate persistent asthma, uncomplicated: Secondary | ICD-10-CM | POA: Diagnosis not present

## 2023-03-15 MED ORDER — ALBUTEROL SULFATE HFA 108 (90 BASE) MCG/ACT IN AERS: INHALATION_SPRAY | RESPIRATORY_TRACT | 5 refills | Status: DC

## 2023-03-15 MED ORDER — BUDESONIDE-FORMOTEROL FUMARATE 160-4.5 MCG/ACT IN AERO: INHALATION_SPRAY | RESPIRATORY_TRACT | 5 refills | Status: DC

## 2023-03-18 ENCOUNTER — Encounter: Payer: Self-pay | Admitting: Family Medicine

## 2023-03-18 NOTE — Progress Notes (Deleted)
.  virtu

## 2023-03-18 NOTE — Progress Notes (Signed)
Virtual Visit via Video Note  I connected with [name] on 03/18/23 at  3:40 PM EDT by a video enabled telemedicine application and verified that I am speaking with the correct person using two identifiers.  Location: Patient: [name] Provider: [place]   I discussed the limitations of evaluation and management by telemedicine and the availability of in person appointments. The patient expressed understanding and agreed to proceed.  History of Present Illness:  Patient reports that she has some occasional increase of sx with the weather change and would like refills of her asthma meds.    Observations/Objective:   Assessment and Plan: 1. Moderate persistent asthma without complication Meds refilled.    Follow Up Instructions: Follow up for yearly physical.    I discussed the assessment and treatment plan with the patient. The patient was provided an opportunity to ask questions and all were answered. The patient agreed with the plan and demonstrated an understanding of the instructions.   The patient was advised to call back or seek an in-person evaluation if the symptoms worsen or if the condition fails to improve as anticipated.  I provided 5 minutes of non-face-to-face time during this encounter.   Becky Sax, MD

## 2023-04-21 ENCOUNTER — Ambulatory Visit: Payer: 59 | Admitting: Family Medicine

## 2023-05-02 ENCOUNTER — Encounter: Payer: Self-pay | Admitting: Physician Assistant

## 2023-05-02 ENCOUNTER — Ambulatory Visit: Payer: 59 | Admitting: Physician Assistant

## 2023-05-02 VITALS — BP 126/78 | HR 105 | Ht 63.0 in | Wt 185.0 lb

## 2023-05-02 DIAGNOSIS — J0300 Acute streptococcal tonsillitis, unspecified: Secondary | ICD-10-CM

## 2023-05-02 LAB — POCT RAPID STREP A (OFFICE): Rapid Strep A Screen: POSITIVE — AB

## 2023-05-02 MED ORDER — AZITHROMYCIN 250 MG PO TABS: ORAL_TABLET | ORAL | 0 refills | Status: DC

## 2023-05-02 NOTE — Progress Notes (Signed)
Established Patient Office Visit  Subjective   Patient ID: [name], female    DOB: 1981/10/23  Age: 42 y.o. MRN: [id number]  Chief Complaint  Patient presents with   Sore Throat    Symptoms started yesterday evening, body chills, body aches, tenderness when swallowing, unsettled stomach.     States that she started feeling poorly last night.  States that she has been experiencing bodyaches, felt feverish, sore throat.  States that she has been having a fullness feeling in her right ear previous to this, states that she has been using Flonase with mild relief.  States that she has tried Mucinex throat spray, gargling with her mouthwash with little relief.  Denies sick contacts.  States that she is staying hydrated but has little appetite.      Past Medical History:  Diagnosis Date   Asthma    Social History   Socioeconomic History   Marital status: Single    Spouse name: Not on file   Number of children: Not on file   Years of education: Not on file   Highest education level: Not on file  Occupational History   Not on file  Tobacco Use   Smoking status: Never   Smokeless tobacco: Never  Substance and Sexual Activity   Alcohol use: Yes    Comment: occ   Drug use: No   Sexual activity: Not on file  Other Topics Concern   Not on file  Social History Narrative   Not on file   Social Determinants of Health   Financial Resource Strain: Not on file  Food Insecurity: Not on file  Transportation Needs: Not on file  Physical Activity: Not on file  Stress: Not on file  Social Connections: Not on file  Intimate Partner Violence: Not on file   Family History  Problem Relation Age of Onset   Healthy Mother    Healthy Father    Stroke Neg Hx    Heart attack Neg Hx    Allergies  Allergen Reactions   Egg-Derived Products Anaphylaxis   Benadryl [Diphenhydramine]    Penicillin G Other (See Comments)   Penicillins Other (See Comments)    Never had PCN due  to parents having allergy    Review of Systems  Constitutional:  Positive for fever.  HENT:  Positive for congestion and sore throat. Negative for sinus pain.   Eyes: Negative.   Respiratory:  Negative for cough, sputum production and shortness of breath.   Cardiovascular:  Negative for chest pain.  Gastrointestinal:  Negative for abdominal pain, diarrhea, nausea and vomiting.  Genitourinary: Negative.   Musculoskeletal:  Positive for myalgias.  Skin: Negative.   Neurological: Negative.   Endo/Heme/Allergies: Negative.   Psychiatric/Behavioral: Negative.        Objective:     BP 126/78 (BP Location: Left Arm, Patient Position: Sitting, Cuff Size: Large)   Pulse (!) 105   Ht 5\' 3"  (1.6 m)   Wt 185 lb (83.9 kg)   LMP 04/22/2023   SpO2 98%   BMI 32.77 kg/m    Physical Exam Vitals and nursing note reviewed.  Constitutional:      Appearance: Normal appearance.  HENT:     Head: Normocephalic.     Jaw: There is normal jaw occlusion.     Salivary Glands: Right salivary gland is diffusely enlarged and tender. Left salivary gland is diffusely enlarged and tender.     Right Ear: Tympanic membrane, ear canal and external ear normal.  Left Ear: Tympanic membrane, ear canal and external ear normal.     Nose: Nose normal.     Right Turbinates: Enlarged.     Left Turbinates: Enlarged.     Right Sinus: No maxillary sinus tenderness or frontal sinus tenderness.     Left Sinus: No maxillary sinus tenderness or frontal sinus tenderness.     Mouth/Throat:     Lips: Pink.     Mouth: Mucous membranes are moist.     Tonsils: Tonsillar exudate present. 1+ on the right. 1+ on the left.  Eyes:     Extraocular Movements: Extraocular movements intact.     Conjunctiva/sclera: Conjunctivae normal.     Pupils: Pupils are equal, round, and reactive to light.  Cardiovascular:     Rate and Rhythm: Tachycardia present.     Pulses: Normal pulses.     Heart sounds: Normal heart sounds.   Pulmonary:     Effort: Pulmonary effort is normal.     Breath sounds: Normal breath sounds.  Musculoskeletal:        General: Normal range of motion.     Cervical back: Normal range of motion and neck supple.  Skin:    General: Skin is warm and dry.  Neurological:     General: No focal deficit present.     Mental Status: She is alert and oriented to person, place, and time.  Psychiatric:        Mood and Affect: Mood normal.        Thought Content: Thought content normal.        Judgment: Judgment normal.         Assessment & Plan:   Problem List Items Addressed This Visit   None Visit Diagnoses     Strep tonsillitis    -  Primary   Relevant Medications   azithromycin (ZITHROMAX) 250 MG tablet   Other Relevant Orders   Rapid Strep A (Completed)     1. Strep tonsillitis Rapid strep positive.  Patient with pen allergy.  Trial azithromycin.  Patient education given on supportive care, red flags given for prompt reevaluation. - azithromycin (ZITHROMAX) 250 MG tablet; Take 2 tabs PO day 1, then take 1 tab PO once daily  Dispense: 6 tablet; Refill: 0 - Rapid Strep A    I have reviewed the patient's medical history (PMH, PSH, Social History, Family History, Medications, and allergies) , and have been updated if relevant. I spent 30 minutes reviewing chart and  face to face time with patient.    Return if symptoms worsen or fail to improve.    Kasandra Knudsen Mayers, PA-C

## 2023-05-02 NOTE — Patient Instructions (Addendum)
Your rapid strep test was positive.  You are going to take azithromycin as directed.  Continue taking your daily allergy medication.  You can use ibuprofen or Tylenol to help with the pain.  Make sure that you stay well-hydrated and get plenty of rest.  I hope that you feel better soon.  Roney Jaffe, PA-C Physician Assistant [facility] Medicine [email or web address]  Strep Throat, Adult Strep throat is an infection in the throat that is caused by bacteria. It is common during the cold months of the year. It mostly affects children who are 41 years old. However, people of all ages can get it at any time of the year. This infection spreads from person to person (is contagious) through coughing, sneezing, or having close contact. Your health care provider may use other names to describe the infection. When strep throat affects the tonsils, it is called tonsillitis. When it affects the back of the throat, it is called pharyngitis. What are the causes? This condition is caused by the Streptococcus pyogenes bacteria. What increases the risk? You are more likely to develop this condition if: You care for school-age children, or are around school-age children. Children are more likely to get strep throat and may spread it to others. You spend time in crowded places where the infection can spread easily. You have close contact with someone who has strep throat. What are the signs or symptoms? Symptoms of this condition include: Fever or chills. Redness, swelling, or pain in the tonsils or throat. Pain or difficulty when swallowing. White or yellow spots on the tonsils or throat. Tender glands in the neck and under the jaw. Bad smelling breath. Red rash all over the body. This is rare. How is this diagnosed? This condition is diagnosed by tests that check for the presence and the amount of bacteria that cause strep throat. They are: Rapid  strep test. Your throat is swabbed and checked for the presence of bacteria. Results are usually ready in minutes. Throat culture test. Your throat is swabbed. The sample is placed in a cup that allows infections to grow. Results are usually ready in 1 or 2 days. How is this treated? This condition may be treated with: Medicines that kill germs (antibiotics). Medicines that relieve pain or fever. These include: Ibuprofen or acetaminophen. Aspirin, only for people who are over the age of 29. Throat lozenges. Throat sprays. Follow these instructions at home: Medicines  Take over-the-counter and prescription medicines only as told by your health care provider. Take your antibiotic medicine as told by your health care provider. Do not stop taking the antibiotic even if you start to feel better. Eating and drinking  If you have trouble swallowing, try eating soft foods until your sore throat feels better. Drink enough fluid to keep your urine pale yellow. To help relieve pain, you may have: Warm fluids, such as soup and tea. Cold fluids, such as frozen desserts or popsicles. General instructions Gargle with a salt-water mixture 3-4 times a day or as needed. To make a salt-water mixture, completely dissolve -1 tsp (3-6 g) of salt in 1 cup (237 mL) of warm water. Get plenty of rest. Stay home from work or school until you have been taking antibiotics for 24 hours. Do not use any products that contain nicotine or tobacco. These products include cigarettes, chewing tobacco, and vaping devices, such as e-cigarettes. If you need help quitting, ask your health care provider. It is up to you to get your  test results. Ask your health care provider, or the department that is doing the test, when your results will be ready. Keep all follow-up visits. This is important. How is this prevented?  Do not share food, drinking cups, or personal items that could cause the infection to spread to other  people. Wash your hands often with soap and water for at least 20 seconds. If soap and water are not available, use hand sanitizer. Make sure that all people in your house wash their hands well. Have family members tested if they have a sore throat or fever. They may need an antibiotic if they have strep throat. Contact a health care provider if: You have swelling in your neck that keeps getting bigger. You develop a rash, cough, or earache. You cough up a thick mucus that is green, yellow-brown, or bloody. You have pain or discomfort that does not get better with medicine. Your symptoms seem to be getting worse. You have a fever. Get help right away if: You have new symptoms, such as vomiting, severe headache, stiff or painful neck, chest pain, or shortness of breath. You have severe throat pain, drooling, or changes in your voice. You have swelling of the neck, or the skin on the neck becomes red and tender. You have signs of dehydration, such as tiredness (fatigue), dry mouth, and decreased urination. You become increasingly sleepy, or you cannot wake up completely. Your joints become red or painful. These symptoms may represent a serious problem that is an emergency. Do not wait to see if the symptoms will go away. Get medical help right away. Call your local emergency services (911 in the [place] Do not drive yourself to the hospital. Summary Strep throat is an infection in the throat that is caused by the Streptococcus pyogenes bacteria. This infection is spread from person to person (is contagious) through coughing, sneezing, or having close contact. Take your medicines, including antibiotics, as told by your health care provider. Do not stop taking the antibiotic even if you start to feel better. To prevent the spread of germs, wash your hands well with soap and water. Have others do the same. Do not share food, drinking cups, or personal items. Get help right away if you have new  symptoms, such as vomiting, severe headache, stiff or painful neck, chest pain, or shortness of breath. This information is not intended to replace advice given to you by your health care provider. Make sure you discuss any questions you have with your health care provider. Document Revised: 03/31/2021 Document Reviewed: 03/31/2021 Elsevier Patient Education  2023 [employer].

## 2023-05-11 ENCOUNTER — Ambulatory Visit: Payer: Self-pay | Admitting: *Deleted

## 2023-05-11 NOTE — Telephone Encounter (Signed)
   Chief Complaint: right upper abdominal pain , feels "bulge " in skin  Symptoms: right upper abdominal pain radiates to back between shoulders. Chest pain / pressure Sunday night reports feeling 2 small bulges quarter size. Chest pressure constant  Frequency: Sunday night  Pertinent Negatives: Patient denies chest pain with difficulty breathing. No sweating no dizziness.  Disposition: [] ED /[] Urgent Care (no appt availability in office) / [x] Appointment(In office/virtual)/ []  [facility]/ [] Home Care/ [] Refused Recommended Disposition /[] [facility]/ []  Follow-up with PCP Additional Notes:   Appt scheduled 05/12/23 with PCP. Recommended if sx worsen go to ED.    Reason for Disposition  [1] MILD-MODERATE pain AND [2] constant AND [3] present > 2 hours  Answer Assessment - Initial Assessment Questions 1. LOCATION: "Where does it hurt?"      Upper right abdominal area 2. RADIATION: "Does the pain shoot anywhere else?" (e.g., chest, back)     Upper back between shoulders  3. ONSET: "When did the pain begin?" (e.g., minutes, hours or days ago)      Sunday night 05/08/23 4. SUDDEN: "Gradual or sudden onset?"     Gradual  5. PATTERN "Does the pain come and go, or is it constant?"    - If it comes and goes: "How long does it last?" "Do you have pain now?"     (Note: Comes and goes means the pain is intermittent. It goes away completely between bouts.)    - If constant: "Is it getting better, staying the same, or getting worse?"      (Note: Constant means the pain never goes away completely; most serious pain is constant and gets worse.)      Comes and goes  6. SEVERITY: "How bad is the pain?"  (e.g., Scale 1-10; mild, moderate, or severe)    - MILD (1-3): Doesn't interfere with normal activities, abdomen soft and not tender to touch..     - MODERATE (4-7): Interferes with normal activities or awakens from sleep, abdomen tender to touch.     - SEVERE (8-10): Excruciating  pain, doubled over, unable to do any normal activities.       Moderate difficulty sleeping chest pain pressure nagging pain  7. RECURRENT SYMPTOM: "Have you ever had this type of stomach pain before?" If Yes, ask: "When was the last time?" and "What happened that time?"      no 8. AGGRAVATING FACTORS: "Does anything seem to cause this pain?" (e.g., foods, stress, alcohol)     Ate some food Sunday "little greasy"  9. CARDIAC SYMPTOMS: "Do you have any of the following symptoms: chest pain, difficulty breathing, sweating, nausea?"    Chest pressure  10. OTHER SYMPTOMS: "Do you have any other symptoms?" (e.g., back pain, diarrhea, fever, urination pain, vomiting)       Chest pressure at times but no difficulty breathing no sweating or dizziness. Only right upper abdominal discomfort 11. PREGNANCY: "Is there any chance you are pregnant?" "When was your last menstrual period?"       na  Protocols used: Abdominal Pain - Upper-A-AH

## 2023-05-12 ENCOUNTER — Emergency Department (HOSPITAL_COMMUNITY): Payer: 59

## 2023-05-12 ENCOUNTER — Ambulatory Visit: Payer: 59 | Admitting: Family Medicine

## 2023-05-12 ENCOUNTER — Observation Stay (HOSPITAL_COMMUNITY)
Admission: EM | Admit: 2023-05-12 | Discharge: 2023-05-14 | Disposition: A | Discharge status: Home / Self Care | Payer: 59 | Attending: General Surgery | Admitting: General Surgery

## 2023-05-12 ENCOUNTER — Other Ambulatory Visit: Payer: Self-pay

## 2023-05-12 ENCOUNTER — Encounter (HOSPITAL_COMMUNITY): Payer: Self-pay | Admitting: Emergency Medicine

## 2023-05-12 DIAGNOSIS — Z79899 Other long term (current) drug therapy: Secondary | ICD-10-CM | POA: Diagnosis not present

## 2023-05-12 DIAGNOSIS — J45909 Unspecified asthma, uncomplicated: Secondary | ICD-10-CM | POA: Insufficient documentation

## 2023-05-12 DIAGNOSIS — K810 Acute cholecystitis: Secondary | ICD-10-CM

## 2023-05-12 DIAGNOSIS — K8012 Calculus of gallbladder with acute and chronic cholecystitis without obstruction: Principal | ICD-10-CM | POA: Insufficient documentation

## 2023-05-12 DIAGNOSIS — R109 Unspecified abdominal pain: Secondary | ICD-10-CM | POA: Diagnosis present

## 2023-05-12 DIAGNOSIS — K8010 Calculus of gallbladder with chronic cholecystitis without obstruction: Secondary | ICD-10-CM

## 2023-05-12 LAB — HEPATIC FUNCTION PANEL
ALT: 15 U/L (ref 0–44)
AST: 20 U/L (ref 15–41)
Albumin: 3.6 g/dL (ref 3.5–5.0)
Alkaline Phosphatase: 48 U/L (ref 38–126)
Bilirubin, Direct: 0.2 mg/dL (ref 0.0–0.2)
Indirect Bilirubin: 0.7 mg/dL (ref 0.3–0.9)
Total Bilirubin: 0.9 mg/dL (ref 0.3–1.2)
Total Protein: 7.4 g/dL (ref 6.5–8.1)

## 2023-05-12 LAB — BASIC METABOLIC PANEL
Anion gap: 11 (ref 5–15)
BUN: 11 mg/dL (ref 6–20)
CO2: 22 mmol/L (ref 22–32)
Calcium: 9.6 mg/dL (ref 8.9–10.3)
Chloride: 102 mmol/L (ref 98–111)
Creatinine, Ser: 0.88 mg/dL (ref 0.44–1.00)
GFR, Estimated: >60 mL/min (ref >60)
Glucose, Bld: 102 mg/dL — ABNORMAL HIGH (ref 70–99)
Potassium: 4 mmol/L (ref 3.5–5.1)
Sodium: 135 mmol/L (ref 135–145)

## 2023-05-12 LAB — CBC
HCT: 44 % (ref 36.0–46.0)
Hemoglobin: 14.6 g/dL (ref 12.0–15.0)
MCH: 30.4 pg (ref 26.0–34.0)
MCHC: 33.2 g/dL (ref 30.0–36.0)
MCV: 91.7 fL (ref 80.0–100.0)
Platelets: 454 10*3/uL — ABNORMAL HIGH (ref 150–400)
RBC: 4.8 MIL/uL (ref 3.87–5.11)
RDW: 12.3 % (ref 11.5–15.5)
WBC: 13.1 10*3/uL — ABNORMAL HIGH (ref 4.0–10.5)
nRBC: 0 % (ref 0.0–0.2)

## 2023-05-12 LAB — TROPONIN I (HIGH SENSITIVITY)
Troponin I (High Sensitivity): 3 ng/L (ref <18)
Troponin I (High Sensitivity): 3 ng/L (ref <18)

## 2023-05-12 LAB — SURGICAL PCR SCREEN
MRSA, PCR: NEGATIVE
Staphylococcus aureus: NEGATIVE

## 2023-05-12 LAB — LIPASE, BLOOD: Lipase: 30 U/L (ref 11–51)

## 2023-05-12 LAB — I-STAT BETA HCG BLOOD, ED (MC, WL, AP ONLY): I-stat hCG, quantitative: <5 m[IU]/mL (ref <5)

## 2023-05-12 MED ORDER — CIPROFLOXACIN IN D5W 400 MG/200ML IV SOLN
400.0000 mg | Freq: Two times a day (BID) | INTRAVENOUS | Status: DC
  Administered 2023-05-12 – 2023-05-14 (×4): 400 mg via INTRAVENOUS
  Filled 2023-05-12 (×5): qty 200

## 2023-05-12 MED ORDER — FAMOTIDINE IN NACL 20-0.9 MG/50ML-% IV SOLN
20.0000 mg | Freq: Once | INTRAVENOUS | Status: AC
  Administered 2023-05-12: 20 mg via INTRAVENOUS
  Filled 2023-05-12: qty 50

## 2023-05-12 MED ORDER — MORPHINE SULFATE (PF) 2 MG/ML IV SOLN
2.0000 mg | Freq: Once | INTRAVENOUS | Status: AC
  Administered 2023-05-12: 2 mg via INTRAVENOUS
  Filled 2023-05-12: qty 1

## 2023-05-12 MED ORDER — MORPHINE SULFATE (PF) 2 MG/ML IV SOLN
1.0000 mg | INTRAVENOUS | Status: DC | PRN
  Administered 2023-05-12: 2 mg via INTRAVENOUS
  Filled 2023-05-12 (×2): qty 1

## 2023-05-12 MED ORDER — METRONIDAZOLE 500 MG/100ML IV SOLN
500.0000 mg | Freq: Once | INTRAVENOUS | Status: AC
  Administered 2023-05-12: 500 mg via INTRAVENOUS
  Filled 2023-05-12: qty 100

## 2023-05-12 MED ORDER — MOMETASONE FURO-FORMOTEROL FUM 200-5 MCG/ACT IN AERO
2.0000 | INHALATION_SPRAY | Freq: Two times a day (BID) | RESPIRATORY_TRACT | Status: DC
  Filled 2023-05-12 (×2): qty 8.8

## 2023-05-12 MED ORDER — PANTOPRAZOLE SODIUM 40 MG IV SOLR
40.0000 mg | Freq: Every day | INTRAVENOUS | Status: DC
  Administered 2023-05-12 – 2023-05-13 (×2): 40 mg via INTRAVENOUS
  Filled 2023-05-12 (×2): qty 10

## 2023-05-12 MED ORDER — ONDANSETRON HCL 4 MG/2ML IJ SOLN
4.0000 mg | Freq: Once | INTRAMUSCULAR | Status: AC
  Administered 2023-05-12: 4 mg via INTRAVENOUS
  Filled 2023-05-12: qty 2

## 2023-05-12 MED ORDER — ACETAMINOPHEN 325 MG PO TABS
650.0000 mg | ORAL_TABLET | Freq: Four times a day (QID) | ORAL | Status: DC | PRN
  Administered 2023-05-12 – 2023-05-14 (×4): 650 mg via ORAL
  Filled 2023-05-12 (×4): qty 2

## 2023-05-12 MED ORDER — ONDANSETRON 4 MG PO TBDP
4.0000 mg | ORAL_TABLET | Freq: Four times a day (QID) | ORAL | Status: DC | PRN
  Filled 2023-05-12: qty 1

## 2023-05-12 MED ORDER — ONDANSETRON HCL 4 MG/2ML IJ SOLN: 4.0000 mg | Freq: Four times a day (QID) | INTRAMUSCULAR | Status: DC | PRN

## 2023-05-12 MED ORDER — SODIUM CHLORIDE 0.9 % IV SOLN: INTRAVENOUS | Status: DC

## 2023-05-12 MED ORDER — FENTANYL CITRATE PF 50 MCG/ML IJ SOSY
50.0000 ug | PREFILLED_SYRINGE | Freq: Once | INTRAMUSCULAR | Status: AC
  Administered 2023-05-12: 50 ug via INTRAVENOUS
  Filled 2023-05-12: qty 1

## 2023-05-12 MED ORDER — OXYCODONE HCL 5 MG PO TABS
5.0000 mg | ORAL_TABLET | ORAL | Status: DC | PRN
  Administered 2023-05-12: 10 mg via ORAL
  Administered 2023-05-12: 5 mg via ORAL
  Administered 2023-05-12 – 2023-05-14 (×5): 10 mg via ORAL
  Filled 2023-05-12: qty 1
  Filled 2023-05-12 (×6): qty 2

## 2023-05-12 MED ORDER — ALBUTEROL SULFATE (2.5 MG/3ML) 0.083% IN NEBU: 2.0000 mL | INHALATION_SOLUTION | RESPIRATORY_TRACT | Status: DC | PRN

## 2023-05-12 MED ORDER — CIPROFLOXACIN IN D5W 400 MG/200ML IV SOLN
400.0000 mg | Freq: Once | INTRAVENOUS | Status: AC
  Administered 2023-05-12: 400 mg via INTRAVENOUS
  Filled 2023-05-12: qty 200

## 2023-05-12 MED ORDER — ALUM & MAG HYDROXIDE-SIMETH 200-200-20 MG/5ML PO SUSP
30.0000 mL | Freq: Once | ORAL | Status: AC
  Administered 2023-05-12: 30 mL via ORAL
  Filled 2023-05-12: qty 30

## 2023-05-12 NOTE — Progress Notes (Signed)
Pt has home Symbicort @bedside . Dulera MDI not given.

## 2023-05-12 NOTE — ED Provider Notes (Signed)
[place] EMERGENCY DEPARTMENT AT [facility] Provider Note   CSN: [id number] Arrival date & time: 05/12/23  4098     History  Chief Complaint  Patient presents with   Chest Pain   Abdominal Pain    [name] is a 42 y.o. female with a PMHx of asthma, who presents to the Emergency Department complaining of sternal chest pain onset 4 days.  Patient notes the symptoms started after eating a case of beer area.  Tried over-the-counter antacids with no relief of their symptoms.  Notes a history of GERD.  However notes that pain increased and is now to the right upper quadrant. Patient notes right upper quadrant pain that started on yesterday.  Patient still has her gallbladder and appendix.  Patient denies nausea, vomiting, urinary symptoms, diarrhea, constipation. Denies PMHx of MI, DM, HTN, CAD, family history of MI in someone younger than age 69, stents.  Patient last had soup at 7 PM.  Patient notes her mother who had a cholecystectomy .   The history is provided by the patient. No language interpreter was used.       Home Medications Prior to Admission medications   Medication Sig Start Date End Date Taking? Authorizing Provider  albuterol (VENTOLIN HFA) 108 (90 Base) MCG/ACT inhaler TAKE 2 PUFFS BY MOUTH EVERY 4 HOURS AS NEEDED 03/15/23   Georganna Skeans, MD  azithromycin (ZITHROMAX) 250 MG tablet Take 2 tabs PO day 1, then take 1 tab PO once daily 05/02/23   Mayers, Cari S, PA-C  budesonide-formoterol (SYMBICORT) 160-4.5 MCG/ACT inhaler TAKE 1 PUFF BY MOUTH EVERY DAY 03/15/23   Georganna Skeans, MD  fluticasone [facility]) 50 MCG/ACT nasal spray Place 1 spray into both nostrils 2 (two) times daily. 05/28/21   Particia Nearing, PA-C  [facility] 1/35 1-35 MG-MCG tablet Take 1 tablet by mouth daily. Patient not taking: Reported on 05/02/2023 12/15/19   [provider]  levalbuterol Pauline Aus) 1.25 MG/3ML nebulizer solution Please specify directions, refills and  quantity 05/21/22   Georganna Skeans, MD  loratadine (CLARITIN) 10 MG tablet Take 10 mg by mouth daily.    [provider]      Allergies    Egg-derived products, Benadryl [diphenhydramine], Penicillin g, and Penicillins    Review of Systems   Review of Systems  Cardiovascular:  Positive for chest pain.  All other systems reviewed and are negative.   Physical Exam Updated Vital Signs BP (!) 141/95 (BP Location: Right Arm)   Pulse 87   Temp 98.3 F (36.8 C) (Oral)   Resp 20   Wt 83.9 kg   LMP 04/22/2023   SpO2 95%   BMI 32.77 kg/m  Physical Exam Vitals and nursing note reviewed.  Constitutional:      General: She is not in acute distress.    Appearance: Normal appearance.  Eyes:     General: No scleral icterus.    Extraocular Movements: Extraocular movements intact.  Cardiovascular:     Rate and Rhythm: Normal rate.  Pulmonary:     Effort: Pulmonary effort is normal. No respiratory distress.  Chest:     Chest wall: No tenderness.  Abdominal:     Palpations: Abdomen is soft. There is no mass.     Tenderness: There is abdominal tenderness. Positive signs include Murphy's sign.  Musculoskeletal:        General: Normal range of motion.     Cervical back: Neck supple.  Skin:    General: Skin is warm  and dry.     Findings: No rash.  Neurological:     Mental Status: She is alert.     Sensory: Sensation is intact.     Motor: Motor function is intact.  Psychiatric:        Behavior: Behavior normal.     ED Results / Procedures / Treatments   Labs (all labs ordered are listed, but only abnormal results are displayed) Labs Reviewed  BASIC METABOLIC PANEL - Abnormal; Notable for the following components:      Result Value   Glucose, Bld 102 (*)    All other components within normal limits  CBC - Abnormal; Notable for the following components:   WBC 13.1 (*)    Platelets 454 (*)    All other components within normal limits  HEPATIC FUNCTION PANEL  LIPASE,  BLOOD  I-STAT BETA HCG BLOOD, ED ([facility], [facility], [facility] ONLY)  TROPONIN I (HIGH SENSITIVITY)  TROPONIN I (HIGH SENSITIVITY)    EKG None  Radiology US Abdomen Limited RUQ (LIVER/GB)  Result Date: 05/12/2023 CLINICAL DATA:  Right upper quadrant pain. EXAM: ULTRASOUND ABDOMEN LIMITED RIGHT UPPER QUADRANT COMPARISON:  None Available. FINDINGS: Gallbladder: Multiple gallstones identified measuring up to 2.7 cm. Gallbladder wall is mildly thickened at 4-5 mm. No pericholecystic fluid. Sonographer reports no sonographic Murphy sign. Common bile duct: Diameter: 4 mm Liver: No focal lesion identified. Within normal limits in parenchymal echogenicity. Portal vein is patent on color Doppler imaging with normal direction of blood flow towards the liver. Other: None. IMPRESSION: Cholelithiasis with mild gallbladder wall thickening. Acute cholecystitis is a concern. Electronically Signed   By: Kennith Center M.D.   On: 05/12/2023 05:30   DG Chest 2 View  Result Date: 05/12/2023 CLINICAL DATA:  Chest pain. EXAM: CHEST - 2 VIEW COMPARISON:  11/18/2022, 09/17/2008. FINDINGS: The heart size and mediastinal contours are within normal limits. There is a vague opacity in the right upper lobe, which is unchanged from prior exams. Degenerative changes are present thoracic spine. No acute osseous abnormality. IMPRESSION: Stable chest with no acute process. Electronically Signed   By: Thornell Sartorius M.D.   On: 05/12/2023 03:09    Procedures Procedures    Medications Ordered in ED Medications  ciprofloxacin (CIPRO) IVPB 400 mg (has no administration in time range)    And  metroNIDAZOLE (FLAGYL) IVPB 500 mg (has no administration in time range)  alum & mag hydroxide-simeth (MAALOX/MYLANTA) 200-200-20 MG/5ML suspension 30 mL (30 mLs Oral Given 05/12/23 0440)  famotidine (PEPCID) IVPB 20 mg premix (20 mg Intravenous Bolus 05/12/23 0439)  ondansetron (ZOFRAN) injection 4 mg (4 mg Intravenous Given 05/12/23 0440)  fentaNYL  (SUBLIMAZE) injection 50 mcg (50 mcg Intravenous Given 05/12/23 0438)  morphine (PF) 2 MG/ML injection 2 mg (2 mg Intravenous Given 05/12/23 6578)    ED Course/ Medical Decision Making/ A&P Clinical Course as of 05/12/23 0636  Thu May 12, 2023  0535 Pt re-evaluated and noted slight improvement of symptoms.  Discussed with patient lab and imaging findings.  Discussed with patient treatment plan consistent of consult with general surgery for likely cholecystectomy.  Answered all available questions.  Patient agreeable at this time. [SB]  [phone] Patient last ate soup at 7 PM on last night. [SB]    Clinical Course User Index [SB] Aadil Sur A, PA-C                             Medical Decision  Making Amount and/or Complexity of Data Reviewed Labs: ordered. Radiology: ordered.  Risk OTC drugs. Prescription drug management.   Patient presents to the ED with chest pain and notably right upper quadrant pain onset yesterday.  Ate spicy food prior to the onset of her symptoms.  Has a history of GERD however does not take medications.  Denies cardiac risk factors.  Patient afebrile, not tachycardic or hypoxic.  On exam patient with positive Murphy sign.  No chest wall tenderness to palpation. Otherwise, no acute cardiovascular or respiratory findings. Differential diagnosis includes ACS, cholecystitis, cholelithiasis, biliary colic, choledocholithiasis, aortic dissection, pneumothorax, PE, PNA.    Labs:  I ordered, and personally interpreted labs.  The pertinent results include:   Initial troponin at 3 Negative hCG Hepatic function panel unremarkable Lipase unremarkable CBC with leukocytosis at 13.1 otherwise unremarkable BMP unremarkable   Imaging: I ordered imaging studies including CXR, right upper quadrant ultrasound I independently visualized and interpreted imaging which showed: No acute findings on chest x-ray.  Right upper quadrant ultrasound with  Cholelithiasis with mild  gallbladder wall thickening. Acute  cholecystitis is a concern.   I agree with the radiologist interpretation  Medications:  I ordered medication including fentanyl, Zofran, GI cocktail, Pepcid for symptom management Reevaluation of the patient after these medicines and interventions, I reevaluated the patient and found that they have improved I have reviewed the patients home medicines and have made adjustments as needed  Patient case discussed with Enos Fling, PA-C at sign-out. Plan at sign-out is pending Surgery consult, likely Admission to the hospital, however, plans may change as per oncoming team. Patient care transferred at sign out.    This chart was dictated using voice recognition software, Dragon. Despite the best efforts of this provider to proofread and correct errors, errors may still occur which can change documentation meaning.  Final Clinical Impression(s) / ED Diagnoses Final diagnoses:  Acute cholecystitis    Rx / DC Orders ED Discharge Orders     None         Maddilyn Campus A, PA-C 05/12/23 0636    Shon Baton, MD 05/12/23 [phone]

## 2023-05-12 NOTE — ED Provider Notes (Signed)
  Physical Exam  BP 119/78   Pulse 85   Temp 98 F (36.7 C) (Oral)   Resp 17   Ht 5\' 3"  (1.6 m)   Wt 86.2 kg   LMP 04/22/2023   SpO2 100%   BMI 33.66 kg/m   Physical Exam Vitals and nursing note reviewed.  Constitutional:      General: She is not in acute distress.    Appearance: Normal appearance.  HENT:     Head: Normocephalic and atraumatic.  Eyes:     General:        Right eye: No discharge.        Left eye: No discharge.  Cardiovascular:     Comments: Regular rate and rhythm.  S1/S2 are distinct without any evidence of murmur, rubs, or gallops.  Radial pulses are 2+ bilaterally.  Dorsalis pedis pulses are 2+ bilaterally.  No evidence of pedal edema. Pulmonary:     Comments: Clear to auscultation bilaterally.  Normal effort.  No respiratory distress.  No evidence of wheezes, rales, or rhonchi heard throughout. Abdominal:     General: Abdomen is flat. Bowel sounds are normal. There is no distension.     Tenderness: There is abdominal tenderness in the right upper quadrant. There is no guarding or rebound. Positive signs include Murphy's sign.  Musculoskeletal:        General: Normal range of motion.     Cervical back: Neck supple.  Skin:    General: Skin is warm and dry.     Findings: No rash.  Neurological:     General: No focal deficit present.     Mental Status: She is alert.  Psychiatric:        Mood and Affect: Mood normal.        Behavior: Behavior normal.     Procedures  Procedures  ED Course / MDM   Clinical Course as of 05/12/23 0700  Thu May 12, 2023  0535 Pt re-evaluated and noted slight improvement of symptoms.  Discussed with patient lab and imaging findings.  Discussed with patient treatment plan consistent of consult with general surgery for likely cholecystectomy.  Answered all available questions.  Patient agreeable at this time. [SB]  [phone] Patient last ate soup at 7 PM on last night. [SB]  0700 I spoke with Dr. Cliffton Asters with general surgery who  agrees to admit the patient. [CF]    Clinical Course User Index [CF] Teressa Lower, PA-C [SB] Blue, Sanjuana Letters, PA-C   Medical Decision Making Laquanna Hupp is a 42 y.o. female patient who presents to the emergency department today for further evaluation of chest pain and right upper quadrant abdominal pain.  Assumed care at shift change this morning.  Reviewing the labs and imaging there is certainly evidence of acute cholecystitis which could be the source of her pain.  Antibiotics were started.  There is evidence of leukocytosis.  Plan for general surgery consult and hopefully admission.   Amount and/or Complexity of Data Reviewed Labs: ordered. Radiology: ordered.  Risk OTC drugs. Prescription drug management.          Honor Loh [place], PA-C 05/12/23 0700    Shon Baton, MD 05/13/23 [phone]

## 2023-05-12 NOTE — H&P (Signed)
[name] is an 42 y.o. female.   Chief Complaint: Abdominal pain HPI: The patient is a 42 year old black female who has been having abdominal pain off and on for the last 5 days or so.  It initially started last weekend but seem to improve.  Yesterday it got more severe.  She denies any nausea or vomiting with it.  She denies any fevers or chills.  She had a scan done in the emergency department that did show thickening of the gallbladder wall with some stones in the gallbladder.  The liver functions were normal.  Past Medical History:  Diagnosis Date   Asthma     Past Surgical History:  Procedure Laterality Date   WISDOM TOOTH EXTRACTION      Family History  Problem Relation Age of Onset   Healthy Mother    Healthy Father    Stroke Neg Hx    Heart attack Neg Hx    Social History:  reports that she has never smoked. She has never used smokeless tobacco. She reports current alcohol use. She reports that she does not use drugs.  Allergies:  Allergies  Allergen Reactions   Egg-Derived Products Anaphylaxis   Benadryl [Diphenhydramine] Other (See Comments)    Excerbated asthma   Penicillin G Other (See Comments)   Penicillins Other (See Comments)    Never had PCN due to parents having allergy    (Not in a hospital admission)   Results for orders placed or performed during the hospital encounter of 05/12/23 (from the past 48 hour(s))  Basic metabolic panel     Status: Abnormal   Collection Time: 05/12/23  2:57 AM  Result Value Ref Range   Sodium 135 135 - 145 mmol/L   Potassium 4.0 3.5 - 5.1 mmol/L   Chloride 102 98 - 111 mmol/L   CO2 22 22 - 32 mmol/L   Glucose, Bld 102 (H) 70 - 99 mg/dL    Comment: Glucose reference range applies only to samples taken after fasting for at least 8 hours.   BUN 11 6 - 20 mg/dL   Creatinine, Ser 1.61 0.44 - 1.00 mg/dL   Calcium 9.6 8.9 - 09.6 mg/dL   GFR, Estimated >04 >54 mL/min    Comment: (NOTE) Calculated using the CKD-EPI  Creatinine Equation (2021)    Anion gap 11 5 - 15    Comment: Performed at [facility], [address] [place], [place] [address]  CBC     Status: Abnormal   Collection Time: 05/12/23  2:57 AM  Result Value Ref Range   WBC 13.1 (H) 4.0 - 10.5 K/uL   RBC 4.80 3.87 - 5.11 MIL/uL   Hemoglobin 14.6 12.0 - 15.0 g/dL   HCT 91.4 78.2 - 95.6 %   MCV 91.7 80.0 - 100.0 fL   MCH 30.4 26.0 - 34.0 pg   MCHC 33.2 30.0 - 36.0 g/dL   RDW 21.3 08.6 - 57.8 %   Platelets 454 (H) 150 - 400 K/uL   nRBC 0.0 0.0 - 0.2 %    Comment: Performed at [facility], [address], [place], [place] [address]  Troponin I (High Sensitivity)     Status: None   Collection Time: 05/12/23  2:57 AM  Result Value Ref Range   Troponin I (High Sensitivity) 3 <18 ng/L    Comment: (NOTE) Elevated high sensitivity troponin I (hsTnI) values and significant  changes across serial measurements may suggest ACS but many other  chronic and acute conditions are known to elevate hsTnI results.  Refer to the "Links" section for chest pain algorithms and additional  guidance. Performed at [facility], [address] [place], [place] [address]   Hepatic function panel     Status: None   Collection Time: 05/12/23  2:57 AM  Result Value Ref Range   Total Protein 7.4 6.5 - 8.1 g/dL   Albumin 3.6 3.5 - 5.0 g/dL   AST 20 15 - 41 U/L   ALT 15 0 - 44 U/L   Alkaline Phosphatase 48 38 - 126 U/L   Total Bilirubin 0.9 0.3 - 1.2 mg/dL   Bilirubin, Direct 0.2 0.0 - 0.2 mg/dL   Indirect Bilirubin 0.7 0.3 - 0.9 mg/dL    Comment: Performed at [facility], [address] [place], [place] [address]  Lipase, blood     Status: None   Collection Time: 05/12/23  2:57 AM  Result Value Ref Range   Lipase 30 11 - 51 U/L    Comment: Performed at [facility], [address], [place], [place] [address]  I-Stat beta hCG blood, ED     Status: None   Collection Time: 05/12/23  3:00 AM  Result Value Ref Range    I-stat hCG, quantitative <5.0 <5 mIU/mL   Comment 3            Comment:   GEST. AGE      CONC.  (mIU/mL)   <=1 WEEK        5 - 50     2 WEEKS       50 - 500     3 WEEKS       100 - 10,000     4 WEEKS     1,000 - 30,000        FEMALE AND NON-PREGNANT FEMALE:     LESS THAN 5 mIU/mL   Troponin I (High Sensitivity)     Status: None   Collection Time: 05/12/23  5:30 AM  Result Value Ref Range   Troponin I (High Sensitivity) 3 <18 ng/L    Comment: (NOTE) Elevated high sensitivity troponin I (hsTnI) values and significant  changes across serial measurements may suggest ACS but many other  chronic and acute conditions are known to elevate hsTnI results.  Refer to the "Links" section for chest pain algorithms and additional  guidance. Performed at [facility], [address] [place], [place] [address]    US Abdomen Limited RUQ (LIVER/GB)  Result Date: 05/12/2023 CLINICAL DATA:  Right upper quadrant pain. EXAM: ULTRASOUND ABDOMEN LIMITED RIGHT UPPER QUADRANT COMPARISON:  None Available. FINDINGS: Gallbladder: Multiple gallstones identified measuring up to 2.7 cm. Gallbladder wall is mildly thickened at 4-5 mm. No pericholecystic fluid. Sonographer reports no sonographic Murphy sign. Common bile duct: Diameter: 4 mm Liver: No focal lesion identified. Within normal limits in parenchymal echogenicity. Portal vein is patent on color Doppler imaging with normal direction of blood flow towards the liver. Other: None. IMPRESSION: Cholelithiasis with mild gallbladder wall thickening. Acute cholecystitis is a concern. Electronically Signed   By: Kennith Center M.D.   On: 05/12/2023 05:30   DG Chest 2 View  Result Date: 05/12/2023 CLINICAL DATA:  Chest pain. EXAM: CHEST - 2 VIEW COMPARISON:  11/18/2022, 09/17/2008. FINDINGS: The heart size and mediastinal contours are within normal limits. There is a vague opacity in the right upper lobe, which is unchanged from prior exams. Degenerative  changes are  present thoracic spine. No acute osseous abnormality. IMPRESSION: Stable chest with no acute process. Electronically Signed   By: Thornell Sartorius M.D.   On: 05/12/2023 03:09    Review of Systems  Blood pressure 137/87, pulse 87, temperature 98 F (36.7 C), temperature source Oral, resp. rate 14, height 5\' 3"  (1.6 m), weight 86.2 kg, last menstrual period 04/22/2023, SpO2 100 %. Physical Exam   Assessment/Plan The patient appears to have symptomatic cholelithiasis with cholecystitis.  At this point we will plan to admit her to the hospital for IV antibiotics and pain control.  We will work on hydrating her.  She will likely benefit from having her gallbladder removed during this hospitalization.  We will work on the timing of the surgery as the OR schedule allows.  I have discussed with her in detail the risks and benefits of the operation as well as some of the technical aspects including the risk of common bile duct injury and she understands and wishes to proceed  Chevis Pretty III, MD 05/12/2023, 8:34 AM

## 2023-05-12 NOTE — ED Notes (Signed)
ED TO INPATIENT HANDOFF REPORT  ED Nurse Name and Phone #: Donnella Bi [phone]  S Name/Age/Gender [name] 42 y.o. female Room/Bed: 039C/039C  Code Status   Code Status: Full Code  Home/SNF/Other Home Patient oriented to: self, place, time, and situation Is this baseline? Yes   Triage Complete: Triage complete  Chief Complaint Cholecystitis with cholelithiasis [K80.10]  Triage Note Pt in with lingering chest burning/pressure since 5/19 first noticed after eating spicy quesadilla that night. Some relief in the days since with antacids, but tonight the pain has left her unable to sleep. Pain now to L chest, RUQ and between shoulder blades.    Allergies Allergies  Allergen Reactions   Egg-Derived Products Anaphylaxis   Benadryl [Diphenhydramine] Other (See Comments)    Excerbated asthma   Penicillin G Other (See Comments)   Penicillins Other (See Comments)    Never had PCN due to parents having allergy    Level of Care/Admitting Diagnosis ED Disposition     ED Disposition  Admit   Condition  --   Comment  Hospital Area: [facility] [[id number]]  Level of Care: Med-Surg [16]  May place patient in observation at [facility] or [facility] if equivalent level of care is available:: No  Covid Evaluation: Asymptomatic - no recent exposure (last 10 days) testing not required  Diagnosis: Cholecystitis with cholelithiasis [454098]  Admitting Physician: CCS, MD [[id number]]  Attending Physician: CCS, MD [[id number]]          B Medical/Surgery History Past Medical History:  Diagnosis Date   Asthma    Past Surgical History:  Procedure Laterality Date   WISDOM TOOTH EXTRACTION       A IV Location/Drains/Wounds Patient Lines/Drains/Airways Status     Active Line/Drains/Airways     Name Placement date Placement time Site Days   Peripheral IV 05/12/23 Anterior;Distal;Left;Upper Arm 05/12/23  0430  Arm  less than 1            Intake/Output  Last 24 hours No intake or output data in the 24 hours ending 05/12/23 0901  Labs/Imaging Results for orders placed or performed during the hospital encounter of 05/12/23 (from the past 48 hour(s))  Basic metabolic panel     Status: Abnormal   Collection Time: 05/12/23  2:57 AM  Result Value Ref Range   Sodium 135 135 - 145 mmol/L   Potassium 4.0 3.5 - 5.1 mmol/L   Chloride 102 98 - 111 mmol/L   CO2 22 22 - 32 mmol/L   Glucose, Bld 102 (H) 70 - 99 mg/dL    Comment: Glucose reference range applies only to samples taken after fasting for at least 8 hours.   BUN 11 6 - 20 mg/dL   Creatinine, Ser 1.19 0.44 - 1.00 mg/dL   Calcium 9.6 8.9 - 14.7 mg/dL   GFR, Estimated >82 >95 mL/min    Comment: (NOTE) Calculated using the CKD-EPI Creatinine Equation (2021)    Anion gap 11 5 - 15    Comment: Performed at [facility], [address], [place], [place] [address]  CBC     Status: Abnormal   Collection Time: 05/12/23  2:57 AM  Result Value Ref Range   WBC 13.1 (H) 4.0 - 10.5 K/uL   RBC 4.80 3.87 - 5.11 MIL/uL   Hemoglobin 14.6 12.0 - 15.0 g/dL   HCT 86.5 78.4 - 69.6 %   MCV 91.7 80.0 - 100.0 fL   MCH 30.4 26.0 - 34.0  pg   MCHC 33.2 30.0 - 36.0 g/dL   RDW 16.1 09.6 - 04.5 %   Platelets 454 (H) 150 - 400 K/uL   nRBC 0.0 0.0 - 0.2 %    Comment: Performed at [facility], [address] [place], [place] [address]  Troponin I (High Sensitivity)     Status: None   Collection Time: 05/12/23  2:57 AM  Result Value Ref Range   Troponin I (High Sensitivity) 3 <18 ng/L    Comment: (NOTE) Elevated high sensitivity troponin I (hsTnI) values and significant  changes across serial measurements may suggest ACS but many other  chronic and acute conditions are known to elevate hsTnI results.  Refer to the "Links" section for chest pain algorithms and additional  guidance. Performed at [facility], [address] [place], [place] [address]   Hepatic function panel     Status:  None   Collection Time: 05/12/23  2:57 AM  Result Value Ref Range   Total Protein 7.4 6.5 - 8.1 g/dL   Albumin 3.6 3.5 - 5.0 g/dL   AST 20 15 - 41 U/L   ALT 15 0 - 44 U/L   Alkaline Phosphatase 48 38 - 126 U/L   Total Bilirubin 0.9 0.3 - 1.2 mg/dL   Bilirubin, Direct 0.2 0.0 - 0.2 mg/dL   Indirect Bilirubin 0.7 0.3 - 0.9 mg/dL    Comment: Performed at [facility], [address], [place], [place] [address]  Lipase, blood     Status: None   Collection Time: 05/12/23  2:57 AM  Result Value Ref Range   Lipase 30 11 - 51 U/L    Comment: Performed at [facility], [address], [place], [place] [address]  I-Stat beta hCG blood, ED     Status: None   Collection Time: 05/12/23  3:00 AM  Result Value Ref Range   I-stat hCG, quantitative <5.0 <5 mIU/mL   Comment 3            Comment:   GEST. AGE      CONC.  (mIU/mL)   <=1 WEEK        5 - 50     2 WEEKS       50 - 500     3 WEEKS       100 - 10,000     4 WEEKS     1,000 - 30,000        FEMALE AND NON-PREGNANT FEMALE:     LESS THAN 5 mIU/mL   Troponin I (High Sensitivity)     Status: None   Collection Time: 05/12/23  5:30 AM  Result Value Ref Range   Troponin I (High Sensitivity) 3 <18 ng/L    Comment: (NOTE) Elevated high sensitivity troponin I (hsTnI) values and significant  changes across serial measurements may suggest ACS but many other  chronic and acute conditions are known to elevate hsTnI results.  Refer to the "Links" section for chest pain algorithms and additional  guidance. Performed at [facility], [address] [place], [place] [address]    US Abdomen Limited RUQ (LIVER/GB)  Result Date: 05/12/2023 CLINICAL DATA:  Right upper quadrant pain. EXAM: ULTRASOUND ABDOMEN LIMITED RIGHT UPPER QUADRANT COMPARISON:  None Available. FINDINGS: Gallbladder: Multiple gallstones identified measuring up to 2.7 cm. Gallbladder wall is mildly thickened at 4-5 mm. No pericholecystic fluid. Sonographer reports no  sonographic Murphy sign. Common bile duct:  Diameter: 4 mm Liver: No focal lesion identified. Within normal limits in parenchymal echogenicity. Portal vein is patent on color Doppler imaging with normal direction of blood flow towards the liver. Other: None. IMPRESSION: Cholelithiasis with mild gallbladder wall thickening. Acute cholecystitis is a concern. Electronically Signed   By: Kennith Center M.D.   On: 05/12/2023 05:30   DG Chest 2 View  Result Date: 05/12/2023 CLINICAL DATA:  Chest pain. EXAM: CHEST - 2 VIEW COMPARISON:  11/18/2022, 09/17/2008. FINDINGS: The heart size and mediastinal contours are within normal limits. There is a vague opacity in the right upper lobe, which is unchanged from prior exams. Degenerative changes are present thoracic spine. No acute osseous abnormality. IMPRESSION: Stable chest with no acute process. Electronically Signed   By: Thornell Sartorius M.D.   On: 05/12/2023 03:09    Pending Labs [employer] (From admission, onward)     Start     Ordered   Signed and Held  HIV Antibody (routine testing w rflx)  (HIV Antibody (Routine testing w reflex) panel)  Once,   R        Signed and Held   Signed and Held  Comprehensive metabolic panel  Tomorrow morning,   R        Signed and Held   Signed and Held  CBC  Tomorrow morning,   R        Signed and Held            Vitals/Pain Today's Vitals   05/12/23 0752 05/12/23 0830 05/12/23 0845 05/12/23 0900  BP: 137/87 129/89  (!) 123/105  Pulse: 87 97 90 87  Resp: 14 15 14 20   Temp: 98 F (36.7 C)     TempSrc: Oral     SpO2: 100% 100% 98% 100%  Weight:      Height:      PainSc:        Isolation Precautions No active isolations  Medications Medications  alum & mag hydroxide-simeth (MAALOX/MYLANTA) 200-200-20 MG/5ML suspension 30 mL (30 mLs Oral Given 05/12/23 0440)  famotidine (PEPCID) IVPB 20 mg premix (20 mg Intravenous Bolus 05/12/23 0439)  ondansetron (ZOFRAN) injection 4 mg (4 mg Intravenous Given  05/12/23 0440)  fentaNYL (SUBLIMAZE) injection 50 mcg (50 mcg Intravenous Given 05/12/23 0438)  morphine (PF) 2 MG/ML injection 2 mg (2 mg Intravenous Given 05/12/23 0626)  ciprofloxacin (CIPRO) IVPB 400 mg (400 mg Intravenous New Bag/Given 05/12/23 0741)    And  metroNIDAZOLE (FLAGYL) IVPB 500 mg (0 mg Intravenous Stopped 05/12/23 0741)    Mobility walks     Focused Assessments abdominal   R Recommendations: See Admitting Provider Note  Report given to:   Additional Notes: .

## 2023-05-12 NOTE — ED Triage Notes (Signed)
Pt in with lingering chest burning/pressure since 5/19 first noticed after eating spicy quesadilla that night. Some relief in the days since with antacids, but tonight the pain has left her unable to sleep. Pain now to L chest, RUQ and between shoulder blades.

## 2023-05-13 ENCOUNTER — Observation Stay (HOSPITAL_COMMUNITY): Payer: 59 | Admitting: Certified Registered"

## 2023-05-13 ENCOUNTER — Observation Stay (HOSPITAL_COMMUNITY): Payer: 59

## 2023-05-13 ENCOUNTER — Encounter (HOSPITAL_COMMUNITY): Payer: Self-pay

## 2023-05-13 ENCOUNTER — Observation Stay (HOSPITAL_BASED_OUTPATIENT_CLINIC_OR_DEPARTMENT_OTHER): Payer: 59 | Admitting: Certified Registered"

## 2023-05-13 ENCOUNTER — Encounter (HOSPITAL_COMMUNITY)
Admission: EM | Disposition: A | Discharge status: Home / Self Care | Payer: Self-pay | Source: Home / Self Care | Attending: Emergency Medicine

## 2023-05-13 DIAGNOSIS — E669 Obesity, unspecified: Secondary | ICD-10-CM | POA: Diagnosis not present

## 2023-05-13 DIAGNOSIS — J45909 Unspecified asthma, uncomplicated: Secondary | ICD-10-CM | POA: Diagnosis not present

## 2023-05-13 DIAGNOSIS — Z6834 Body mass index (BMI) 34.0-34.9, adult: Secondary | ICD-10-CM | POA: Diagnosis not present

## 2023-05-13 DIAGNOSIS — K8010 Calculus of gallbladder with chronic cholecystitis without obstruction: Secondary | ICD-10-CM

## 2023-05-13 HISTORY — PX: CHOLECYSTECTOMY: SHX55

## 2023-05-13 LAB — COMPREHENSIVE METABOLIC PANEL
ALT: 15 U/L (ref 0–44)
AST: 18 U/L (ref 15–41)
Albumin: 3 g/dL — ABNORMAL LOW (ref 3.5–5.0)
Alkaline Phosphatase: 44 U/L (ref 38–126)
Anion gap: 8 (ref 5–15)
BUN: 10 mg/dL (ref 6–20)
CO2: 22 mmol/L (ref 22–32)
Calcium: 8.5 mg/dL — ABNORMAL LOW (ref 8.9–10.3)
Chloride: 103 mmol/L (ref 98–111)
Creatinine, Ser: 0.91 mg/dL (ref 0.44–1.00)
GFR, Estimated: >60 mL/min (ref >60)
Glucose, Bld: 102 mg/dL — ABNORMAL HIGH (ref 70–99)
Potassium: 4.3 mmol/L (ref 3.5–5.1)
Sodium: 133 mmol/L — ABNORMAL LOW (ref 135–145)
Total Bilirubin: 0.9 mg/dL (ref 0.3–1.2)
Total Protein: 6.7 g/dL (ref 6.5–8.1)

## 2023-05-13 LAB — CBC
HCT: 42.6 % (ref 36.0–46.0)
Hemoglobin: 13.6 g/dL (ref 12.0–15.0)
MCH: 29.8 pg (ref 26.0–34.0)
MCHC: 31.9 g/dL (ref 30.0–36.0)
MCV: 93.2 fL (ref 80.0–100.0)
Platelets: 227 10*3/uL (ref 150–400)
RBC: 4.57 MIL/uL (ref 3.87–5.11)
RDW: 12.5 % (ref 11.5–15.5)
WBC: 13.9 10*3/uL — ABNORMAL HIGH (ref 4.0–10.5)
nRBC: 0 % (ref 0.0–0.2)

## 2023-05-13 LAB — HIV ANTIBODY (ROUTINE TESTING W REFLEX): HIV Screen 4th Generation wRfx: NONREACTIVE

## 2023-05-13 SURGERY — LAPAROSCOPIC CHOLECYSTECTOMY
Anesthesia: General

## 2023-05-13 MED ORDER — BUPIVACAINE-EPINEPHRINE (PF) 0.25% -1:200000 IJ SOLN
INTRAMUSCULAR | Status: AC
  Filled 2023-05-13: qty 30

## 2023-05-13 MED ORDER — CHLORHEXIDINE GLUCONATE 0.12 % MT SOLN
15.0000 mL | Freq: Once | OROMUCOSAL | Status: AC
  Administered 2023-05-13: 15 mL via OROMUCOSAL
  Filled 2023-05-13: qty 15

## 2023-05-13 MED ORDER — LACTATED RINGERS IV SOLN: INTRAVENOUS | Status: DC

## 2023-05-13 MED ORDER — SCOPOLAMINE 1 MG/3DAYS TD PT72
MEDICATED_PATCH | TRANSDERMAL | Status: AC
  Administered 2023-05-13: 1.5 mg via TRANSDERMAL
  Filled 2023-05-13: qty 1

## 2023-05-13 MED ORDER — FENTANYL CITRATE (PF) 250 MCG/5ML IJ SOLN
INTRAMUSCULAR | Status: AC
  Filled 2023-05-13: qty 5

## 2023-05-13 MED ORDER — MIDAZOLAM HCL 2 MG/2ML IJ SOLN
INTRAMUSCULAR | Status: DC | PRN
  Administered 2023-05-13: 2 mg via INTRAVENOUS

## 2023-05-13 MED ORDER — BUPIVACAINE-EPINEPHRINE 0.25% -1:200000 IJ SOLN
INTRAMUSCULAR | Status: DC | PRN
  Administered 2023-05-13: 24 mL

## 2023-05-13 MED ORDER — LIDOCAINE 2% (20 MG/ML) 5 ML SYRINGE
INTRAMUSCULAR | Status: AC
  Filled 2023-05-13: qty 5

## 2023-05-13 MED ORDER — 0.9 % SODIUM CHLORIDE (POUR BTL) OPTIME
TOPICAL | Status: DC | PRN
  Administered 2023-05-13: 1000 mL

## 2023-05-13 MED ORDER — SCOPOLAMINE 1 MG/3DAYS TD PT72: 1.0000 | MEDICATED_PATCH | TRANSDERMAL | Status: DC

## 2023-05-13 MED ORDER — SUGAMMADEX SODIUM 200 MG/2ML IV SOLN
INTRAVENOUS | Status: DC | PRN
  Administered 2023-05-13: 200 mg via INTRAVENOUS

## 2023-05-13 MED ORDER — ACETAMINOPHEN 10 MG/ML IV SOLN
INTRAVENOUS | Status: AC
  Filled 2023-05-13: qty 100

## 2023-05-13 MED ORDER — ORAL CARE MOUTH RINSE: 15.0000 mL | Freq: Once | OROMUCOSAL | Status: AC

## 2023-05-13 MED ORDER — ONDANSETRON HCL 4 MG/2ML IJ SOLN
INTRAMUSCULAR | Status: AC
  Filled 2023-05-13: qty 2

## 2023-05-13 MED ORDER — ONDANSETRON HCL 4 MG/2ML IJ SOLN
INTRAMUSCULAR | Status: DC | PRN
  Administered 2023-05-13: 4 mg via INTRAVENOUS

## 2023-05-13 MED ORDER — HYDROMORPHONE HCL 1 MG/ML IJ SOLN
0.2500 mg | INTRAMUSCULAR | Status: DC | PRN
  Administered 2023-05-13 (×2): 0.5 mg via INTRAVENOUS

## 2023-05-13 MED ORDER — SODIUM CHLORIDE 0.9 % IR SOLN
Status: DC | PRN
  Administered 2023-05-13: 1000 mL

## 2023-05-13 MED ORDER — HYDROMORPHONE HCL 1 MG/ML IJ SOLN
INTRAMUSCULAR | Status: AC
  Filled 2023-05-13: qty 1

## 2023-05-13 MED ORDER — MIDAZOLAM HCL 2 MG/2ML IJ SOLN
INTRAMUSCULAR | Status: AC
  Filled 2023-05-13: qty 2

## 2023-05-13 MED ORDER — PROPOFOL 10 MG/ML IV BOLUS
INTRAVENOUS | Status: AC
  Filled 2023-05-13: qty 20

## 2023-05-13 MED ORDER — ACETAMINOPHEN 10 MG/ML IV SOLN
INTRAVENOUS | Status: DC | PRN
  Administered 2023-05-13: 1000 mg via INTRAVENOUS

## 2023-05-13 MED ORDER — PROPOFOL 10 MG/ML IV BOLUS
INTRAVENOUS | Status: DC | PRN
  Administered 2023-05-13: 150 mg via INTRAVENOUS

## 2023-05-13 MED ORDER — FENTANYL CITRATE (PF) 250 MCG/5ML IJ SOLN
INTRAMUSCULAR | Status: DC | PRN
  Administered 2023-05-13: 150 ug via INTRAVENOUS
  Administered 2023-05-13: 50 ug via INTRAVENOUS

## 2023-05-13 MED ORDER — OXYCODONE HCL 5 MG PO TABS: 5.0000 mg | ORAL_TABLET | Freq: Once | ORAL | Status: DC | PRN

## 2023-05-13 MED ORDER — LIDOCAINE 2% (20 MG/ML) 5 ML SYRINGE
INTRAMUSCULAR | Status: DC | PRN
  Administered 2023-05-13: 60 mg via INTRAVENOUS

## 2023-05-13 MED ORDER — OXYCODONE HCL 5 MG/5ML PO SOLN: 5.0000 mg | Freq: Once | ORAL | Status: DC | PRN

## 2023-05-13 MED ORDER — ROCURONIUM BROMIDE 10 MG/ML (PF) SYRINGE
PREFILLED_SYRINGE | INTRAVENOUS | Status: DC | PRN
  Administered 2023-05-13: 50 mg via INTRAVENOUS

## 2023-05-13 MED ORDER — DROPERIDOL 2.5 MG/ML IJ SOLN: 0.6250 mg | Freq: Once | INTRAMUSCULAR | Status: DC | PRN

## 2023-05-13 MED ORDER — ONDANSETRON HCL 4 MG/2ML IJ SOLN: 4.0000 mg | Freq: Once | INTRAMUSCULAR | Status: DC | PRN

## 2023-05-13 SURGICAL SUPPLY — 41 items
ADH SKN CLS APL DERMABOND .7 (GAUZE/BANDAGES/DRESSINGS) ×1
APL PRP STRL LF DISP 70% ISPRP (MISCELLANEOUS) ×1
APPLIER CLIP 5 13 M/L LIGAMAX5 (MISCELLANEOUS) ×1
APR CLP MED LRG 5 ANG JAW (MISCELLANEOUS) ×1
BAG COUNTER SPONGE SURGICOUNT (BAG) ×1 IMPLANT
BAG SPNG CNTER NS LX DISP (BAG) ×1
BLADE CLIPPER SURG (BLADE) IMPLANT
CANISTER SUCT 3000ML PPV (MISCELLANEOUS) ×1 IMPLANT
CATH REDDICK CHOLANGI 4FR 50CM (CATHETERS) ×1 IMPLANT
CHLORAPREP W/TINT 26 (MISCELLANEOUS) ×1 IMPLANT
CLIP APPLIE 5 13 M/L LIGAMAX5 (MISCELLANEOUS) ×1 IMPLANT
COVER MAYO STAND STRL (DRAPES) ×1 IMPLANT
COVER SURGICAL LIGHT HANDLE (MISCELLANEOUS) ×1 IMPLANT
DERMABOND ADVANCED .7 DNX12 (GAUZE/BANDAGES/DRESSINGS) ×1 IMPLANT
DRAPE C-ARM 42X120 X-RAY (DRAPES) ×1 IMPLANT
ELECT REM PT RETURN 9FT ADLT (ELECTROSURGICAL) ×1
ELECTRODE REM PT RTRN 9FT ADLT (ELECTROSURGICAL) ×1 IMPLANT
GLOVE BIO SURGEON STRL SZ7.5 (GLOVE) ×1 IMPLANT
GOWN STRL REUS W/ TWL LRG LVL3 (GOWN DISPOSABLE) ×3 IMPLANT
GOWN STRL REUS W/TWL LRG LVL3 (GOWN DISPOSABLE) ×3
IRRIG SUCT STRYKERFLOW 2 WTIP (MISCELLANEOUS) ×1
IRRIGATION SUCT STRKRFLW 2 WTP (MISCELLANEOUS) ×1 IMPLANT
IV CATH 14GX2 1/4 (CATHETERS) ×1 IMPLANT
KIT BASIN OR (CUSTOM PROCEDURE TRAY) ×1 IMPLANT
KIT TURNOVER KIT B (KITS) ×1 IMPLANT
NS IRRIG 1000ML POUR BTL (IV SOLUTION) ×1 IMPLANT
PAD ARMBOARD 7.5X6 YLW CONV (MISCELLANEOUS) ×1 IMPLANT
SCISSORS LAP 5X35 DISP (ENDOMECHANICALS) ×1 IMPLANT
SET TUBE SMOKE EVAC HIGH FLOW (TUBING) ×1 IMPLANT
SLEEVE Z-THREAD 5X100MM (TROCAR) ×2 IMPLANT
SPECIMEN JAR SMALL (MISCELLANEOUS) ×1 IMPLANT
SUT MNCRL AB 4-0 PS2 18 (SUTURE) ×1 IMPLANT
SYS BAG RETRIEVAL 10MM (BASKET) ×1
SYSTEM BAG RETRIEVAL 10MM (BASKET) ×1 IMPLANT
TOWEL GREEN STERILE (TOWEL DISPOSABLE) ×1 IMPLANT
TOWEL GREEN STERILE FF (TOWEL DISPOSABLE) ×1 IMPLANT
TRAY LAPAROSCOPIC MC (CUSTOM PROCEDURE TRAY) ×1 IMPLANT
TROCAR BALLN 12MMX100 BLUNT (TROCAR) ×1 IMPLANT
TROCAR Z-THREAD OPTICAL 5X100M (TROCAR) ×1 IMPLANT
WARMER LAPAROSCOPE (MISCELLANEOUS) ×1 IMPLANT
WATER STERILE IRR 1000ML POUR (IV SOLUTION) ×1 IMPLANT

## 2023-05-13 NOTE — Transfer of Care (Signed)
Immediate Anesthesia Transfer of Care Note  Patient: [name]  Procedure(s) Performed: LAPAROSCOPIC CHOLECYSTECTOMY  Patient Location: PACU  Anesthesia Type:General  Level of Consciousness: awake, oriented, and drowsy  Airway & Oxygen Therapy: Patient Spontanous Breathing  Post-op Assessment: Report given to RN and Post -op Vital signs reviewed and stable  Post vital signs: Reviewed and stable  Last Vitals:  Vitals Value Taken Time  BP 130/71 05/13/23 1300  Temp    Pulse 102 05/13/23 1302  Resp 15 05/13/23 1302  SpO2 96 % 05/13/23 1302  Vitals shown include unvalidated device data.  Last Pain:  Vitals:   05/13/23 1050  TempSrc:   PainSc: 6       Patients Stated Pain Goal: 3 (05/12/23 0726)  Complications: No notable events documented.

## 2023-05-13 NOTE — Interval H&P Note (Signed)
History and Physical Interval Note:  05/13/2023 11:31 AM  [name]  has presented today for surgery, with the diagnosis of cholecystitis with stones.  The various methods of treatment have been discussed with the patient and family. After consideration of risks, benefits and other options for treatment, the patient has consented to  Procedure(s): LAPAROSCOPIC CHOLECYSTECTOMY WITH INTRAOPERATIVE CHOLANGIOGRAM (N/A) as a surgical intervention.  The patient's history has been reviewed, patient examined, no change in status, stable for surgery.  I have reviewed the patient's chart and labs.  Questions were answered to the patient's satisfaction.     Chevis Pretty III

## 2023-05-13 NOTE — Anesthesia Postprocedure Evaluation (Signed)
Anesthesia Post Note  Patient: [name]  Procedure(s) Performed: LAPAROSCOPIC CHOLECYSTECTOMY     Patient location during evaluation: PACU Anesthesia Type: General Level of consciousness: awake and alert Pain management: pain level controlled Vital Signs Assessment: post-procedure vital signs reviewed and stable Respiratory status: spontaneous breathing, nonlabored ventilation, respiratory function stable and patient connected to nasal cannula oxygen Cardiovascular status: blood pressure returned to baseline and stable Postop Assessment: no apparent nausea or vomiting Anesthetic complications: no   No notable events documented.  Last Vitals:  Vitals:   05/13/23 1415 05/13/23 1559  BP: (!) 115/58 111/75  Pulse: 96 85  Resp: 12 17  Temp: 36.7 C (!) 36.4 C  SpO2: 94% 96%    Last Pain:  Vitals:   05/13/23 1559  TempSrc: Oral  PainSc:                  Nelle Don Tatum Massman

## 2023-05-13 NOTE — Op Note (Signed)
05/13/2023  12:47 PM  PATIENT:  [name]  42 y.o. female  PRE-OPERATIVE DIAGNOSIS:  cholecystitis with stones  POST-OPERATIVE DIAGNOSIS:  cholecystitis with stones  PROCEDURE:  Procedure(s): LAPAROSCOPIC CHOLECYSTECTOMY (N/A)  SURGEON:  Surgeon(s) and Role:    * Griselda Miner, MD - Primary  PHYSICIAN ASSISTANT:   ASSISTANTS: Rockwell Germany, RNFA   ANESTHESIA:   local and general  EBL:  minimal   BLOOD ADMINISTERED:none  DRAINS: none   LOCAL MEDICATIONS USED:  MARCAINE     SPECIMEN:  Source of Specimen:  gallbladder  DISPOSITION OF SPECIMEN:  PATHOLOGY  COUNTS:  YES  TOURNIQUET:  * No tourniquets in log *  DICTATION: .Dragon Dictation    Procedure: After informed consent was obtained the patient was brought to the operating room and placed in the supine position on the operating room table. After adequate induction of general anesthesia the patient's abdomen was prepped with ChloraPrep allowed to dry and draped in usual sterile manner. An appropriate timeout was performed. The area below the umbilicus was infiltrated with quarter percent  Marcaine. A small incision was made with a 15 blade knife. The incision was carried down through the subcutaneous tissue bluntly with a hemostat and [employer] retractors. The linea alba was identified. The linea alba was incised with a 15 blade knife and each side was grasped with Coker clamps. The preperitoneal space was then probed with a hemostat until the peritoneum was opened and access was gained to the abdominal cavity. A 0 Vicryl pursestring stitch was placed in the fascia surrounding the opening. A Hassan cannula was then placed through the opening and anchored in place with the previously placed Vicryl purse string stitch. The abdomen was insufflated with carbon dioxide without difficulty. A laparoscope was inserted through the [facility] cannula in the right upper quadrant was inspected. The gallbladder was noted to be very  edematous. Next the epigastric region was infiltrated with % Marcaine. A small incision was made with a 15 blade knife. A 5 mm port was placed bluntly through this incision into the abdominal cavity under direct vision. Next 2 sites were chosen laterally on the right side of the abdomen for placement of 5 mm ports. Each of these areas was infiltrated with quarter percent Marcaine. Small stab incisions were made with a 15 blade knife. 5 mm ports were then placed bluntly through these incisions into the abdominal cavity under direct vision without difficulty. A blunt grasper was placed through the lateralmost 5 mm port and used to grasp the dome of the gallbladder and elevate it anteriorly and superiorly. Another blunt grasper was placed through the other 5 mm port and used to retract the body and neck of the gallbladder. A dissector was placed through the epigastric port and using the electrocautery the peritoneal reflection at the gallbladder neck was opened. Blunt dissection was then carried out in this area until the gallbladder neck-cystic duct junction was readily identified and a good critical window was created. 2 clips were placed proximally on the cystic duct and one distally and the duct was divided between the 2 sets of clips. Posterior to this the cystic artery was identified and again dissected bluntly in a circumferential manner until a good window  was created. 2 clips were placed proximally and one distally on the artery and the artery was divided between the 2 sets of clips. Next a laparoscopic hook cautery device was used to separate the gallbladder from the liver bed. Prior to completely detaching  the gallbladder from the liver bed the liver bed was inspected and several small bleeding points were coagulated with the electrocautery until the area was completely hemostatic. The gallbladder was then detached the rest of it from the liver bed without difficulty. A laparoscopic bag was inserted  through the hassan port. The laparoscope was moved to the epigastric port. The gallbladder was placed within the bag and the bag was sealed.  The bag with the gallbladder was then removed with the [facility] cannula through the infraumbilical port without difficulty. The fascial defect was then closed with the previously placed Vicryl pursestring stitch as well as with another figure-of-eight 0 Vicryl stitch. The liver bed was inspected again and found to be hemostatic. The abdomen was irrigated with copious amounts of saline until the effluent was clear. The ports were then removed under direct vision without difficulty and were found to be hemostatic. The gas was allowed to escape. No other abnormalities were noted on general inspection of the abdomen. The skin incisions were all closed with interrupted 4-0 Monocryl subcuticular stitches. Dermabond dressings were applied. The patient tolerated the procedure well. At the end of the case all needle sponge and instrument counts were correct. The patient was then awakened and taken to recovery in stable condition  PLAN OF CARE: Admit to inpatient   PATIENT DISPOSITION:  PACU - hemodynamically stable.   Delay start of Pharmacological VTE agent (>24hrs) due to surgical blood loss or risk of bleeding: no

## 2023-05-13 NOTE — Discharge Instructions (Signed)
LAPAROSCOPIC SURGERY: POST OP INSTRUCTIONS Always review your discharge instruction sheet given to you by the facility where your surgery was performed. IF YOU HAVE DISABILITY OR FAMILY LEAVE FORMS, YOU MUST BRING THEM TO THE OFFICE FOR PROCESSING.   DO NOT GIVE THEM TO YOUR DOCTOR.  PAIN CONTROL  First take acetaminophen (Tylenol) AND/or ibuprofen (Advil) to control your pain after surgery.  Follow directions on package.  Taking acetaminophen (Tylenol) and/or ibuprofen (Advil) regularly after surgery will help to control your pain and lower the amount of prescription pain medication you may need.  You should not take more than 3,000 mg (3 grams) of acetaminophen (Tylenol) in 24 hours.  You should not take ibuprofen (Advil), aleve, motrin, naprosyn or other NSAIDS if you have a history of stomach ulcers or chronic kidney disease.  A prescription for pain medication may be given to you upon discharge.  Take your pain medication as prescribed, if you still have uncontrolled pain after taking acetaminophen (Tylenol) or ibuprofen (Advil). Use ice packs to help control pain. If you need a refill on your pain medication, please contact your pharmacy.  They will contact our office to request authorization. Prescriptions will not be filled after 5pm or on week-ends.  HOME MEDICATIONS Take your usually prescribed medications unless otherwise directed.  DIET You should follow a light diet the first few days after arrival home.  Be sure to include lots of fluids daily. Avoid fatty, fried foods.   CONSTIPATION It is common to experience some constipation after surgery and if you are taking pain medication.  Increasing fluid intake and taking a stool softener (such as Colace) will usually help or prevent this problem from occurring.  A mild laxative (Milk of Magnesia or Miralax) should be taken according to package instructions if there are no bowel movements after 48 hours.  WOUND/INCISION CARE Most  patients will experience some swelling and bruising in the area of the incisions.  Ice packs will help.  Swelling and bruising can take several days to resolve.  Unless discharge instructions indicate otherwise, follow guidelines below  STERI-STRIPS - you may remove your outer bandages 48 hours after surgery, and you may shower at that time.  You have steri-strips (small skin tapes) in place directly over the incision.  These strips should be left on the skin for 7-10 days.   DERMABOND/SKIN GLUE - you may shower in 24 hours.  The glue will flake off over the next 2-3 weeks. Any sutures or staples will be removed at the office during your follow-up visit.  ACTIVITIES You may resume regular (light) daily activities beginning the next day--such as daily self-care, walking, climbing stairs--gradually increasing activities as tolerated.  You may have sexual intercourse when it is comfortable.  Refrain from any heavy lifting or straining until approved by your doctor. You may drive when you are no longer taking prescription pain medication, you can comfortably wear a seatbelt, and you can safely maneuver your car and apply brakes.  FOLLOW-UP You should see your doctor in the office for a follow-up appointment approximately 2-3 weeks after your surgery.  You should have been given your post-op/follow-up appointment when your surgery was scheduled.  If you did not receive a post-op/follow-up appointment, make sure that you call for this appointment within a day or two after you arrive home to insure a convenient appointment time.   WHEN TO CALL YOUR DOCTOR: Fever over 101.0 Inability to urinate Continued bleeding from incision. Increased pain, redness, or drainage   from the incision. Increasing abdominal pain  The clinic staff is available to answer your questions during regular business hours.  Please don't hesitate to call and ask to speak to one of the nurses for clinical concerns.  If you have a  medical emergency, go to the nearest emergency room or call 911.  A surgeon from [facility] is always on call at the hospital. [address], [address], [place], [place]  [address] ? [address] [address], [place],    [address] [phone] ? [phone] ? FAX [phone] Web site: [email or web address]      Managing Your Pain After Surgery Without Opioids    Thank you for participating in our program to help patients manage their pain after surgery without opioids. This is part of our effort to provide you with the best care possible, without exposing you or your family to the risk that opioids pose.  What pain can I expect after surgery? You can expect to have some pain after surgery. This is normal. The pain is typically worse the day after surgery, and quickly begins to get better. Many studies have found that many patients are able to manage their pain after surgery with Over-the-Counter (OTC) medications such as Tylenol and Motrin. If you have a condition that does not allow you to take Tylenol or Motrin, notify your surgical team.  How will I manage my pain? The best strategy for controlling your pain after surgery is around the clock pain control with Tylenol (acetaminophen) and Motrin (ibuprofen or Advil). Alternating these medications with each other allows you to maximize your pain control. In addition to Tylenol and Motrin, you can use heating pads or ice packs on your incisions to help reduce your pain.  How will I alternate your regular strength over-the-counter pain medication? You will take a dose of pain medication every three hours. Start by taking 650 mg of Tylenol (2 pills of 325 mg) 3 hours later take 600 mg of Motrin (3 pills of 200 mg) 3 hours after taking the Motrin take 650 mg of Tylenol 3 hours after that take 600 mg of Motrin.   - 1 -  See example - if your first dose of Tylenol is at 12:00 PM   12:00 PM Tylenol 650 mg (2  pills of 325 mg)  3:00 PM Motrin 600 mg (3 pills of 200 mg)  6:00 PM Tylenol 650 mg (2 pills of 325 mg)  9:00 PM Motrin 600 mg (3 pills of 200 mg)  Continue alternating every 3 hours   We recommend that you follow this schedule around-the-clock for at least 3 days after surgery, or until you feel that it is no longer needed. Use the table on the last page of this handout to keep track of the medications you are taking. Important: Do not take more than 3000mg of Tylenol or 3200mg of Motrin in a 24-hour period. Do not take ibuprofen/Motrin if you have a history of bleeding stomach ulcers, severe kidney disease, &/or actively taking a blood thinner  What if I still have pain? If you have pain that is not controlled with the over-the-counter pain medications (Tylenol and Motrin or Advil) you might have what we call "breakthrough" pain. You will receive a prescription for a small amount of an opioid pain medication such as Oxycodone, Tramadol, or Tylenol with Codeine. Use these opioid pills in the first 24 hours after surgery if you have breakthrough pain. Do not take more than 1   pill every 4-6 hours.  If you still have uncontrolled pain after using all opioid pills, don't hesitate to call our staff using the number provided. We will help make sure you are managing your pain in the best way possible, and if necessary, we can provide a prescription for additional pain medication.   Day 1    Time  Name of Medication Number of pills taken  Amount of Acetaminophen  Pain Level   Comments  AM PM       AM PM       AM PM       AM PM       AM PM       AM PM       AM PM       AM PM       Total Daily amount of Acetaminophen Do not take more than  3,000 mg per day      Day 2    Time  Name of Medication Number of pills taken  Amount of Acetaminophen  Pain Level   Comments  AM PM       AM PM       AM PM       AM PM       AM PM       AM PM       AM PM       AM PM       Total Daily  amount of Acetaminophen Do not take more than  3,000 mg per day      Day 3    Time  Name of Medication Number of pills taken  Amount of Acetaminophen  Pain Level   Comments  AM PM       AM PM       AM PM       AM PM         AM PM       AM PM       AM PM       AM PM       Total Daily amount of Acetaminophen Do not take more than  3,000 mg per day      Day 4    Time  Name of Medication Number of pills taken  Amount of Acetaminophen  Pain Level   Comments  AM PM       AM PM       AM PM       AM PM       AM PM       AM PM       AM PM       AM PM       Total Daily amount of Acetaminophen Do not take more than  3,000 mg per day      Day 5    Time  Name of Medication Number of pills taken  Amount of Acetaminophen  Pain Level   Comments  AM PM       AM PM       AM PM       AM PM       AM PM       AM PM       AM PM       AM PM       Total Daily amount of Acetaminophen Do not take more than  3,000 mg per day        Day 6    Time  Name of Medication Number of pills taken  Amount of Acetaminophen  Pain Level  Comments  AM PM       AM PM       AM PM       AM PM       AM PM       AM PM       AM PM       AM PM       Total Daily amount of Acetaminophen Do not take more than  3,000 mg per day      Day 7    Time  Name of Medication Number of pills taken  Amount of Acetaminophen  Pain Level   Comments  AM PM       AM PM       AM PM       AM PM       AM PM       AM PM       AM PM       AM PM       Total Daily amount of Acetaminophen Do not take more than  3,000 mg per day        For additional information about how and where to safely dispose of unused opioid medications - https://www.morepowerfulnc.org  Disclaimer: This document contains information and/or instructional materials adapted from [facility] for the typical patient with your condition. It does not replace medical advice from your health care provider because  your experience may differ from that of the typical patient. Talk to your health care provider if you have any questions about this document, your condition or your treatment plan. Adapted from [facility]  

## 2023-05-13 NOTE — Plan of Care (Signed)

## 2023-05-13 NOTE — Anesthesia Procedure Notes (Signed)
Procedure Name: Intubation Date/Time: 05/13/2023 11:52 AM  Performed by: Cheree Ditto, CRNAPre-anesthesia Checklist: Patient identified, Emergency Drugs available, Suction available and Patient being monitored Patient Re-evaluated:Patient Re-evaluated prior to induction Oxygen Delivery Method: Circle system utilized Preoxygenation: Pre-oxygenation with 100% oxygen Induction Type: IV induction Ventilation: Mask ventilation without difficulty Laryngoscope Size: Mac and 3 Grade View: Grade I Tube type: Oral Number of attempts: 1 Airway Equipment and Method: Stylet and Oral airway Placement Confirmation: ETT inserted through vocal cords under direct vision, positive ETCO2 and breath sounds checked- equal and bilateral Secured at: 22 cm Tube secured with: Tape Dental Injury: Teeth and Oropharynx as per pre-operative assessment

## 2023-05-13 NOTE — Anesthesia Preprocedure Evaluation (Signed)
Anesthesia Evaluation  Patient identified by MRN, date of birth, ID band Patient awake    Reviewed: Allergy & Precautions, NPO status , Patient's Chart, lab work & pertinent test results  Airway Mallampati: II       Dental no notable dental hx. (+) Caps, Dental Advisory Given,    Pulmonary asthma    Pulmonary exam normal breath sounds clear to auscultation       Cardiovascular negative cardio ROS Normal cardiovascular exam Rhythm:Regular Rate:Normal     Neuro/Psych negative neurological ROS  negative psych ROS   GI/Hepatic Neg liver ROS,,,Cholelithiasis with cholecystitis   Endo/Other  Obesity  Renal/GU negative Renal ROS  negative genitourinary   Musculoskeletal negative musculoskeletal ROS (+)    Abdominal  (+) + obese Abdomen: tender.   Peds  Hematology negative hematology ROS (+)   Anesthesia Other Findings   Reproductive/Obstetrics negative OB ROS                             Anesthesia Physical Anesthesia Plan  ASA: 2  Anesthesia Plan: General   Post-op Pain Management: Dilaudid IV and Ofirmev IV (intra-op)*   Induction: Intravenous, Rapid sequence and Cricoid pressure planned  PONV Risk Score and Plan: 4 or greater and Treatment may vary due to age or medical condition, Midazolam, Scopolamine patch - Pre-op, Dexamethasone and Ondansetron  Airway Management Planned: Oral ETT  Additional Equipment: None  Intra-op Plan:   Post-operative Plan: Extubation in OR  Informed Consent: I have reviewed the patients History and Physical, chart, labs and discussed the procedure including the risks, benefits and alternatives for the proposed anesthesia with the patient or authorized representative who has indicated his/her understanding and acceptance.     Dental advisory given  Plan Discussed with: Anesthesiologist and CRNA  Anesthesia Plan Comments:        Anesthesia  Quick Evaluation

## 2023-05-14 ENCOUNTER — Encounter (HOSPITAL_COMMUNITY): Payer: Self-pay | Admitting: General Surgery

## 2023-05-14 MED ORDER — OXYCODONE HCL 5 MG PO TABS: 5.0000 mg | ORAL_TABLET | ORAL | 0 refills | Status: AC | PRN

## 2023-05-14 MED ORDER — ACETAMINOPHEN 325 MG PO TABS: 1000.0000 mg | ORAL_TABLET | Freq: Four times a day (QID) | ORAL | Status: AC | PRN

## 2023-05-14 NOTE — Discharge Summary (Signed)
    Patient IDUnice Koelzer Graham [id number] 05/30/1981 42 y.o.  Admit date: 05/12/2023 Discharge date: 05/14/2023  Admitting Diagnosis: cholecystitis  Discharge Diagnosis Patient Active Problem List   Diagnosis Date Noted   Cholecystitis with cholelithiasis 05/12/2023   Asthma 05/24/2019   Environmental allergies 04/24/2018    Consultants none  Reason for Admission: The patient is a 42 year old black female who has been having abdominal pain off and on for the last 5 days or so. It initially started last weekend but seem to improve. Yesterday it got more severe. She denies any nausea or vomiting with it. She denies any fevers or chills. She had a scan done in the emergency department that did show thickening of the gallbladder wall with some stones in the gallbladder. The liver functions were normal.   Procedures Lap chole, Dr. Carolynne Edouard 05/13/23  Hospital Course:  The patient was admitted and underwent a laparoscopic cholecystectomy.  The patient tolerated the procedure well.  On POD 1, the patient was tolerating a regular diet, voiding well, mobilizing, and pain was controlled with oral pain medications.  The patient was stable for DC home at this time with appropriate follow up made.   Physical Exam: Abd: soft, appropriately tender, +BS, ND, incisions c/d/i  Allergies as of 05/14/2023       Reactions   Egg-derived Products Anaphylaxis   Benadryl [diphenhydramine] Other (See Comments)   Excerbated asthma   Penicillin G Other (See Comments)   Penicillins Other (See Comments)   Never had PCN due to parents having allergy        Medication List     TAKE these medications    acetaminophen 325 MG tablet Commonly known as: TYLENOL Take 3 tablets (975 mg total) by mouth every 6 (six) hours as needed for mild pain or fever.   albuterol 108 (90 Base) MCG/ACT inhaler Commonly known as: VENTOLIN HFA TAKE 2 PUFFS BY MOUTH EVERY 4 HOURS AS NEEDED What changed:  how much to  take how to take this when to take this reasons to take this additional instructions   budesonide-formoterol 160-4.5 MCG/ACT inhaler Commonly known as: Symbicort TAKE 1 PUFF BY MOUTH EVERY DAY What changed:  how much to take when to take this additional instructions   levalbuterol 1.25 MG/3ML nebulizer solution Commonly known as: XOPENEX Please specify directions, refills and quantity   oxyCODONE 5 MG immediate release tablet Commonly known as: Oxy IR/ROXICODONE Take 1 tablet (5 mg total) by mouth every 4 (four) hours as needed for moderate pain.   Quercetin 500 MG Caps Take 500 mg by mouth daily.          Follow-up Information     Maczis, Hedda Slade, [place]. Call.   Specialty: General Surgery Why: We are working on scheduling a follow up appointment in about 3 weeks, please call to confirm appointment date/time. Please arrive 30 min prior to appointment time. Contact information: [address] [place] [address] [facility] [place] [address] [phone]                 Signed: Barnetta Chapel, [facility] Surgery 05/14/2023, 10:31 AM Please see Amion for pager number during day hours 7:00am-4:30pm, 7-11:30am on Weekends

## 2023-05-17 ENCOUNTER — Telehealth: Payer: Self-pay

## 2023-05-17 ENCOUNTER — Encounter: Payer: 59 | Admitting: Family Medicine

## 2023-05-17 LAB — SURGICAL PATHOLOGY

## 2023-05-17 NOTE — Transitions of Care (Post Inpatient/ED Visit) (Signed)
   05/17/2023  Name: [name] MRN: [id number] DOB: 10-17-1981  Today's TOC FU Call Status: Today's TOC FU Call Status:: Successful TOC FU Call Competed TOC FU Call Complete Date: 05/17/23  Transition Care Management Follow-up Telephone Call Date of Discharge: 05/14/23 Discharge Facility: [facility]) Type of Discharge: Inpatient Admission Primary Inpatient Discharge Diagnosis:: cholecystitis and cholelithiasis How have you been since you were released from the hospital?: Better Any questions or concerns?: No  Items Reviewed: Did you receive and understand the discharge instructions provided?: Yes Medications obtained,verified, and reconciled?: No Medications Not Reviewed Reasons:: Other: (She said she has all of her medications as well as a nubulizer and did not have any questions about the med regime and did not need to review the med list) Any new allergies since your discharge?: No Dietary orders reviewed?: Yes Type of Diet Ordered:: heart healthy low sodium Do you have support at home?: Yes  Medications Reviewed Today:  these are not the meds listed on her AVS.   She said she has all meds and did not need to review the med list Medications Reviewed Today     Reviewed by Mal Amabile, MD (Physician) on 05/13/23 at 1106  Med List Status: Complete   Medication Order Taking? Sig Documenting Provider Last Dose Status Informant  albuterol (VENTOLIN HFA) 108 (90 Base) MCG/ACT inhaler [id number] Yes TAKE 2 PUFFS BY MOUTH EVERY 4 HOURS AS NEEDED  Patient taking differently: Inhale 2 puffs into the lungs every 4 (four) hours as needed for wheezing or shortness of breath.   Georganna Skeans, MD Past Week Active Self  budesonide-formoterol [facility]) 160-4.5 MCG/ACT inhaler [id number] Yes TAKE 1 PUFF BY MOUTH EVERY DAY  Patient taking differently: 1 puff daily.   Georganna Skeans, MD 05/11/2023 am Active Self  levalbuterol [facility]) 1.25 MG/3ML nebulizer solution [id number] Yes  Please specify directions, refills and quantity Georganna Skeans, MD Past Month Active Self  Quercetin 500 MG CAPS [id number] Yes Take 500 mg by mouth daily. [provider] 05/11/2023 Active Self            Home Care and Equipment/Supplies: Were Home Health Services Ordered?: No Any new equipment or medical supplies ordered?: No  Functional Questionnaire: Do you need assistance with bathing/showering or dressing?: No Do you need assistance with meal preparation?: No Do you need assistance with eating?: No Do you have difficulty maintaining continence: No Do you need assistance with getting out of bed/getting out of a chair/moving?: No Do you have difficulty managing or taking your medications?: No  Follow up appointments reviewed: PCP Follow-up appointment confirmed?: NA MD Provider Line [phone] Given: No Date of PCP follow-up appointment?: 07/12/23 Follow-up Provider: Dr Andrey Campanile - her appointment on 07/12/2023 is for her physical.  She had an appointment today but cancelled because she just returned home from the hospital Specialist Hospital Follow-up appointment confirmed?: No Reason Specialist Follow-Up Not Confirmed: Patient has Specialist Provider Number and will Call for Appointment (She will call the general surgeon to schedule her appointment) Do you need transportation to your follow-up appointment?: No Do you understand care options if your condition(s) worsen?: Yes-patient verbalized understanding    SIGNATURE Robyne Peers, RN

## 2023-05-18 ENCOUNTER — Other Ambulatory Visit: Payer: Self-pay

## 2023-07-12 ENCOUNTER — Ambulatory Visit (INDEPENDENT_AMBULATORY_CARE_PROVIDER_SITE_OTHER): Payer: 59 | Admitting: Family Medicine

## 2023-07-12 VITALS — BP 103/69 | HR 88 | Temp 98.3°F | Resp 20 | Ht 63.0 in | Wt 194.0 lb

## 2023-07-12 DIAGNOSIS — Z0000 Encounter for general adult medical examination without abnormal findings: Secondary | ICD-10-CM

## 2023-07-12 DIAGNOSIS — Z130 Encounter for screening for diseases of the blood and blood-forming organs and certain disorders involving the immune mechanism: Secondary | ICD-10-CM

## 2023-07-12 DIAGNOSIS — Z13220 Encounter for screening for lipoid disorders: Secondary | ICD-10-CM

## 2023-07-12 NOTE — Progress Notes (Unsigned)
-  Patient is here to have annually  complete physical examination  -Care gap address -labs taken  

## 2023-07-13 ENCOUNTER — Encounter: Payer: Self-pay | Admitting: Family Medicine

## 2023-07-13 LAB — TSH: TSH: 2.07 u[IU]/mL (ref 0.450–4.500)

## 2023-07-13 LAB — CMP14+EGFR
ALT: 33 IU/L — ABNORMAL HIGH (ref 0–32)
AST: 24 IU/L (ref 0–40)
Albumin: 4.1 g/dL (ref 3.9–4.9)
Alkaline Phosphatase: 59 IU/L (ref 44–121)
BUN/Creatinine Ratio: 16 (ref 9–23)
BUN: 13 mg/dL (ref 6–24)
Bilirubin Total: 0.3 mg/dL (ref 0.0–1.2)
CO2: 23 mmol/L (ref 20–29)
Calcium: 9.6 mg/dL (ref 8.7–10.2)
Chloride: 105 mmol/L (ref 96–106)
Creatinine, Ser: 0.82 mg/dL (ref 0.57–1.00)
Globulin, Total: 2.8 g/dL (ref 1.5–4.5)
Glucose: 92 mg/dL (ref 70–99)
Potassium: 4.5 mmol/L (ref 3.5–5.2)
Sodium: 140 mmol/L (ref 134–144)
Total Protein: 6.9 g/dL (ref 6.0–8.5)
eGFR: 92 mL/min/{1.73_m2} (ref >59)

## 2023-07-13 LAB — CBC WITH DIFFERENTIAL/PLATELET
Basophils Absolute: 0 10*3/uL (ref 0.0–0.2)
Basos: 1 %
EOS (ABSOLUTE): 0.3 10*3/uL (ref 0.0–0.4)
Eos: 4 %
Hematocrit: 41.8 % (ref 34.0–46.6)
Hemoglobin: 13.6 g/dL (ref 11.1–15.9)
Immature Grans (Abs): 0 10*3/uL (ref 0.0–0.1)
Immature Granulocytes: 0 %
Lymphocytes Absolute: 1.7 10*3/uL (ref 0.7–3.1)
Lymphs: 24 %
MCH: 29.8 pg (ref 26.6–33.0)
MCHC: 32.5 g/dL (ref 31.5–35.7)
MCV: 92 fL (ref 79–97)
Monocytes Absolute: 0.6 10*3/uL (ref 0.1–0.9)
Monocytes: 8 %
Neutrophils Absolute: 4.5 10*3/uL (ref 1.4–7.0)
Neutrophils: 63 %
Platelets: 393 10*3/uL (ref 150–450)
RBC: 4.57 x10E6/uL (ref 3.77–5.28)
RDW: 13.3 % (ref 11.7–15.4)
WBC: 7.1 10*3/uL (ref 3.4–10.8)

## 2023-07-13 LAB — LIPID PANEL
Chol/HDL Ratio: 3.2 ratio (ref 0.0–4.4)
Cholesterol, Total: 159 mg/dL (ref 100–199)
HDL: 50 mg/dL (ref >39)
LDL Chol Calc (NIH): 95 mg/dL (ref 0–99)
Triglycerides: 75 mg/dL (ref 0–149)
VLDL Cholesterol Cal: 14 mg/dL (ref 5–40)

## 2023-07-13 LAB — HEMOGLOBIN A1C
Est. average glucose Bld gHb Est-mCnc: 120 mg/dL
Hgb A1c MFr Bld: 5.8 % — ABNORMAL HIGH (ref 4.8–5.6)

## 2023-07-13 LAB — VITAMIN D 25 HYDROXY (VIT D DEFICIENCY, FRACTURES): Vit D, 25-Hydroxy: 44.7 ng/mL (ref 30.0–100.0)

## 2023-07-13 NOTE — Progress Notes (Signed)
Established Patient Office Visit  Subjective    Patient ID: [name], female    DOB: [birth year]  Age: 42 y.o. MRN: [id number]  CC:  Chief Complaint  Patient presents with   Annual Exam    HPI [name] presents for routine annual exam. Denies acute complaints.    Outpatient Encounter Medications as of 07/12/2023  Medication Sig   acetaminophen (TYLENOL) 325 MG tablet Take 3 tablets (975 mg total) by mouth every 6 (six) hours as needed for mild pain or fever.   albuterol (VENTOLIN HFA) 108 (90 Base) MCG/ACT inhaler TAKE 2 PUFFS BY MOUTH EVERY 4 HOURS AS NEEDED (Patient taking differently: Inhale 2 puffs into the lungs every 4 (four) hours as needed for wheezing or shortness of breath.)   budesonide-formoterol (SYMBICORT) 160-4.5 MCG/ACT inhaler TAKE 1 PUFF BY MOUTH EVERY DAY (Patient taking differently: 1 puff daily.)   levalbuterol (XOPENEX) 1.25 MG/3ML nebulizer solution Please specify directions, refills and quantity   oxyCODONE (OXY IR/ROXICODONE) 5 MG immediate release tablet Take 1 tablet (5 mg total) by mouth every 4 (four) hours as needed for moderate pain.   Quercetin 500 MG CAPS Take 500 mg by mouth daily. (Patient not taking: Reported on 07/12/2023)   No facility-administered encounter medications on file as of 07/12/2023.    Past Medical History:  Diagnosis Date   Asthma     Past Surgical History:  Procedure Laterality Date   CHOLECYSTECTOMY N/A 05/13/2023   Procedure: LAPAROSCOPIC CHOLECYSTECTOMY;  Surgeon: Griselda Miner, MD;  Location: [facility] OR;  Service: General;  Laterality: N/A;   WISDOM TOOTH EXTRACTION      Family History  Problem Relation Age of Onset   Healthy Mother    Healthy Father    Stroke Neg Hx    Heart attack Neg Hx     Social History   Socioeconomic History   Marital status: Single    Spouse name: Not on file   Number of children: Not on file   Years of education: Not on file   Highest education level: Not on file   Occupational History   Not on file  Tobacco Use   Smoking status: Never   Smokeless tobacco: Never  Substance and Sexual Activity   Alcohol use: Yes    Comment: occ   Drug use: No   Sexual activity: Not on file  Other Topics Concern   Not on file  Social History Narrative   Not on file   Social Determinants of Health   Financial Resource Strain: Not on file  Food Insecurity: Not on file  Transportation Needs: Not on file  Physical Activity: Not on file  Stress: Not on file  Social Connections: Not on file  Intimate Partner Violence: Not on file    Review of Systems  All other systems reviewed and are negative.       Objective    BP 103/69   Pulse 88   Temp 98.3 F (36.8 C) (Temporal)   Resp 20   Ht 5\' 3"  (1.6 m)   Wt 194 lb (88 kg)   SpO2 98%   BMI 34.37 kg/m   Physical Exam Vitals and nursing note reviewed.  Constitutional:      General: She is not in acute distress. Cardiovascular:     Rate and Rhythm: Normal rate and regular rhythm.  Pulmonary:     Effort: Pulmonary effort is normal.     Breath sounds: Normal breath sounds. No wheezing.  Abdominal:  Palpations: Abdomen is soft.     Tenderness: There is no abdominal tenderness.  Neurological:     General: No focal deficit present.     Mental Status: She is alert and oriented to person, place, and time.         Assessment & Plan:   1. Annual physical exam  - CMP14+EGFR  2. Screening for deficiency anemia  - CBC with Differential  3. Screening for lipid disorders  - Lipid Panel  4. Screening for endocrine/metabolic/immunity disorders  - Vitamin D, 25-hydroxy - TSH - Hemoglobin A1c    No follow-ups on file.   Christina Raymond, MD

## 2023-07-14 ENCOUNTER — Telehealth: Payer: Self-pay | Admitting: Family Medicine

## 2023-07-14 NOTE — Telephone Encounter (Signed)
Copied from CRM [id number]. Topic: Complaint - Billing/Coding >> Jul 14, 2023 12:36 PM Franchot Heidelberg wrote: DOS: 07/12/2023 Details of complaint: Pt presented her [facility] card and requested for her labs to be sent to [facility] instead of [employer] because she works for [employer] and gets free lab work there How would the patient like to see this issue resolved?   Please call patient back, I advised patient that her insurance should cover her CPE because we typically get one free CPE a year so she shouldn't be charged for anything anyway.    Route to Research officer, political party.

## 2023-07-15 NOTE — Telephone Encounter (Signed)
Information has been sent to Practice Admin

## 2023-07-15 NOTE — Telephone Encounter (Signed)
Practice Administration  is aware of situation and will take care of it.

## 2023-09-30 ENCOUNTER — Other Ambulatory Visit: Payer: Self-pay | Admitting: Family Medicine

## 2023-09-30 ENCOUNTER — Ambulatory Visit
Admission: RE | Admit: 2023-09-30 | Discharge: 2023-09-30 | Disposition: A | Discharge status: Home / Self Care | Payer: 59 | Source: Ambulatory Visit | Attending: Family Medicine | Admitting: Family Medicine

## 2023-09-30 DIAGNOSIS — Z1231 Encounter for screening mammogram for malignant neoplasm of breast: Secondary | ICD-10-CM

## 2023-10-10 ENCOUNTER — Other Ambulatory Visit: Payer: Self-pay | Admitting: Family Medicine

## 2023-10-10 NOTE — Telephone Encounter (Signed)
Medication Refill - Medication: albuterol (VENTOLIN HFA) 108 (90 Base) MCG/ACT inhaler   Has the patient contacted their pharmacy? No.  Preferred Pharmacy (with phone number or street name):  [id number] [place], [place] - [address] Phone: [phone]  Fax: [phone]     Has the patient been seen for an appointment in the last year OR does the patient have an upcoming appointment? Yes.    Agent: Please be advised that RX refills may take up to 3 business days. We ask that you follow-up with your pharmacy.

## 2023-10-11 MED ORDER — ALBUTEROL SULFATE HFA 108 (90 BASE) MCG/ACT IN AERS: INHALATION_SPRAY | RESPIRATORY_TRACT | 5 refills | Status: DC

## 2023-10-11 NOTE — Telephone Encounter (Signed)
Requested Prescriptions  Pending Prescriptions Disp Refills   albuterol (VENTOLIN HFA) 108 (90 Base) MCG/ACT inhaler 8.5 each 5    Sig: TAKE 2 PUFFS BY MOUTH EVERY 4 HOURS AS NEEDED     Pulmonology:  Beta Agonists 2 Passed - 10/10/2023  9:58 AM      Passed - Last BP in normal range    BP Readings from Last 1 Encounters:  07/12/23 103/69         Passed - Last Heart Rate in normal range    Pulse Readings from Last 1 Encounters:  07/12/23 88         Passed - Valid encounter within last 12 months    Recent Outpatient Visits           3 months ago Annual physical exam   [place] [facility], Lauris Poag, MD   7 months ago Moderate persistent asthma without complication   [place] [facility], MD   1 year ago Moderate persistent asthma without complication   [place] [facility], MD   1 year ago Moderate persistent asthma without complication   [place] [facility], Gildardo Pounds, NP   3 years ago Annual physical exam   [facility], Kandee Keen, MD

## 2024-05-21 ENCOUNTER — Other Ambulatory Visit: Payer: Self-pay | Admitting: Family Medicine

## 2024-06-21 ENCOUNTER — Other Ambulatory Visit: Payer: Self-pay | Admitting: Family Medicine

## 2024-07-20 ENCOUNTER — Other Ambulatory Visit: Payer: Self-pay | Admitting: Family Medicine

## 2024-07-25 ENCOUNTER — Other Ambulatory Visit: Payer: Self-pay | Admitting: Family Medicine

## 2024-07-25 NOTE — Telephone Encounter (Signed)
 Copied from CRM #[id number]. Topic: Clinical - Medication Refill >> Jul 25, 2024 10:39 AM Tiffany S wrote: Medication: albuterol  (VENTOLIN  HFA) 108 (90 Base) MCG/ACT inhaler [[id number]] budesonide -formoterol  (SYMBICORT ) 160-4.5 MCG/ACT inhaler [[id number]]  Has the patient contacted their pharmacy? Yes (Agent: If no, request that the patient contact the pharmacy for the refill. If patient does not wish to contact the pharmacy document the reason why and proceed with request.) (Agent: If yes, when and what did the pharmacy advise?)  This is the patient's preferred pharmacy:  [id number] [place], [place] - [address] [place] [address] Phone: [phone] Fax: [phone]  Is this the correct pharmacy for this prescription? Yes If no, delete pharmacy and type the correct one.   Has the prescription been filled recently? Yes  Is the patient out of the medication? Yes  Has the patient been seen for an appointment in the last year OR does the patient have an upcoming appointment? Yes  Can we respond through MyChart? Yes  Agent: Please be advised that Rx refills may take up to 3 business days. We ask that you follow-up with your pharmacy.

## 2024-07-27 NOTE — Telephone Encounter (Signed)
 Requested medications are due for refill today.  yes  Requested medications are on the active medications list.  yes  Last refill. Albuterol  06/22/2023, Symbicort  03/15/2023  Future visit scheduled.   yes  Notes to clinic.  Pt last seen 07/12/2023.    Requested Prescriptions  Pending Prescriptions Disp Refills   albuterol  (VENTOLIN  HFA) 108 (90 Base) MCG/ACT inhaler 6.7 each 0    Sig: Inhale 2 puffs into the lungs every 4 (four) hours as needed.     Pulmonology:  Beta Agonists 2 Failed - 07/27/2024  5:57 PM      Failed - Valid encounter within last 12 months    Recent Outpatient Visits           1 year ago Annual physical exam   [place] [facility], MD   1 year ago Moderate persistent asthma without complication   [place] [facility], MD   2 years ago Moderate persistent asthma without complication   [place] [facility], MD   2 years ago Moderate persistent asthma without complication   [place] [facility], Bascom RAMAN, NP   4 years ago Annual physical exam   [facility], Dorothyann Maxwell, DO              Passed - Last BP in normal range    BP Readings from Last 1 Encounters:  07/12/23 103/69         Passed - Last Heart Rate in normal range    Pulse Readings from Last 1 Encounters:  07/12/23 88          budesonide -formoterol  (SYMBICORT ) 160-4.5 MCG/ACT inhaler 10.2 g 5    Sig: TAKE 1 PUFF BY MOUTH EVERY DAY     Pulmonology:  Combination Products Failed - 07/27/2024  5:57 PM      Failed - Valid encounter within last 12 months    Recent Outpatient Visits           1 year ago Annual physical exam   [place] [facility], MD   1 year ago Moderate persistent asthma without complication   [place] [facility], MD   2  years ago Moderate persistent asthma without complication   [place] [facility], MD   2 years ago Moderate persistent asthma without complication   [place] [facility], Bascom RAMAN, NP   4 years ago Annual physical exam   [facility], Dorothyann Maxwell, DO

## 2024-07-30 ENCOUNTER — Telehealth: Payer: Self-pay

## 2024-07-30 NOTE — Telephone Encounter (Signed)
 Copied from CRM [id number]. Topic: Clinical - Medication Question >> Jul 30, 2024  2:37 PM DeAngela L wrote: Reason for CRM: patient called to schedule appt for medication refill and would like to ask if the provider is going to help her get a medication refill before her appt 09/10/24 due to her being out of medication now  albuterol  (VENTOLIN  HFA) 108 (90 Base) MCG/ACT inhaler budesonide -formoterol  (SYMBICORT ) 160-4.5 MCG/ACT inhaler  Pt num [phone]   [id number] [place], [place] - [address] [place] [address] Phone: [phone] Fax: [phone]

## 2024-08-01 NOTE — Telephone Encounter (Addendum)
 The patient has been added on the wait list due to no available appointment dates. I provided the mobile clinic address and timings to [id number] to relay the information to the patient.  [facility]  [address] [address]  Timings: 9 am- 6:30 pm.

## 2024-08-01 NOTE — Telephone Encounter (Signed)
 Copied from CRM #[id number]. Topic: Clinical - Medication Question >> Aug 01, 2024 10:03 AM Debby BROCKS wrote:  Reason for CRM: Patient needs to be seen earlier than sept. 22 due to her inhaler running out. She advised she cannot get another inhaler until she is seen. Spoke with Primary care and was told she has been put on waitlist. Clinic also provided the workaround of having the patient pass by the mobile clinic @ [facility] [address] [place], [place], [place] [address] to get a refill. Patient will pass by today after work.

## 2024-08-02 NOTE — Telephone Encounter (Signed)
 Pt coming in 8/21

## 2024-08-09 ENCOUNTER — Ambulatory Visit (INDEPENDENT_AMBULATORY_CARE_PROVIDER_SITE_OTHER): Admitting: Family Medicine

## 2024-08-09 ENCOUNTER — Encounter: Payer: Self-pay | Admitting: Family Medicine

## 2024-08-09 VITALS — BP 122/77 | HR 85 | Ht 63.0 in | Wt 190.6 lb

## 2024-08-09 DIAGNOSIS — Z0000 Encounter for general adult medical examination without abnormal findings: Secondary | ICD-10-CM | POA: Diagnosis not present

## 2024-08-09 DIAGNOSIS — Z1329 Encounter for screening for other suspected endocrine disorder: Secondary | ICD-10-CM

## 2024-08-09 DIAGNOSIS — Z13220 Encounter for screening for lipoid disorders: Secondary | ICD-10-CM | POA: Diagnosis not present

## 2024-08-09 DIAGNOSIS — Z130 Encounter for screening for diseases of the blood and blood-forming organs and certain disorders involving the immune mechanism: Secondary | ICD-10-CM | POA: Diagnosis not present

## 2024-08-09 DIAGNOSIS — Z13228 Encounter for screening for other metabolic disorders: Secondary | ICD-10-CM

## 2024-08-09 MED ORDER — ALBUTEROL SULFATE HFA 108 (90 BASE) MCG/ACT IN AERS: 2.0000 | INHALATION_SPRAY | RESPIRATORY_TRACT | 5 refills | Status: AC | PRN

## 2024-08-09 MED ORDER — BUDESONIDE-FORMOTEROL FUMARATE 160-4.5 MCG/ACT IN AERO: INHALATION_SPRAY | RESPIRATORY_TRACT | 11 refills | Status: AC

## 2024-08-09 NOTE — Progress Notes (Signed)
 Established Patient Office Visit  Subjective    Patient ID: [name], female    DOB: Jun 23, 1981  Age: 43 y.o. MRN: [id number]  CC:  Chief Complaint  Patient presents with   Medical Management of Chronic Issues    Pt needs refills. Pt also reports she has a rash on her lower abdomen and thinks it might be ringworm     HPI [name] presents for routine annual exam. Patient denies acute complaints or concerns.  Outpatient Encounter Medications as of 08/09/2024  Medication Sig   levalbuterol (XOPENEX) 1.25 MG/3ML nebulizer solution Please specify directions, refills and quantity   Quercetin 500 MG CAPS Take 500 mg by mouth daily.   [DISCONTINUED] albuterol  (VENTOLIN  HFA) 108 (90 Base) MCG/ACT inhaler INHALE 2 PUFFS BY MOUTH EVERY 4 HOURS AS NEEDED   [DISCONTINUED] budesonide -formoterol  (SYMBICORT ) 160-4.5 MCG/ACT inhaler TAKE 1 PUFF BY MOUTH EVERY DAY   acetaminophen  (TYLENOL ) 325 MG tablet Take 3 tablets (975 mg total) by mouth every 6 (six) hours as needed for mild pain or fever. (Patient not taking: Reported on 08/09/2024)   albuterol  (VENTOLIN  HFA) 108 (90 Base) MCG/ACT inhaler Inhale 2 puffs into the lungs every 4 (four) hours as needed.   budesonide -formoterol  (SYMBICORT ) 160-4.5 MCG/ACT inhaler TAKE 1 PUFF BY MOUTH EVERY DAY   oxyCODONE  (OXY IR/ROXICODONE ) 5 MG immediate release tablet Take 1 tablet (5 mg total) by mouth every 4 (four) hours as needed for moderate pain. (Patient not taking: Reported on 08/09/2024)   No facility-administered encounter medications on file as of 08/09/2024.    Past Medical History:  Diagnosis Date   Asthma     Past Surgical History:  Procedure Laterality Date   CHOLECYSTECTOMY N/A 05/13/2023   Procedure: LAPAROSCOPIC CHOLECYSTECTOMY;  Surgeon: Curvin Deward MOULD, MD;  Location: [facility] OR;  Service: General;  Laterality: N/A;   WISDOM TOOTH EXTRACTION      Family History  Problem Relation Age of Onset   Healthy Mother    Healthy  Father    Stroke Neg Hx    Heart attack Neg Hx    Breast cancer Neg Hx     Social History   Socioeconomic History   Marital status: Single    Spouse name: Not on file   Number of children: Not on file   Years of education: Not on file   Highest education level: Not on file  Occupational History   Not on file  Tobacco Use   Smoking status: Never   Smokeless tobacco: Never  Substance and Sexual Activity   Alcohol use: Yes    Comment: occ   Drug use: No   Sexual activity: Not on file  Other Topics Concern   Not on file  Social History Narrative   Not on file   Social Drivers of Health   Financial Resource Strain: Not on file  Food Insecurity: Not on file  Transportation Needs: Not on file  Physical Activity: Not on file  Stress: Not on file  Social Connections: Moderately Integrated (08/09/2024)   Social Connection and Isolation Panel    Frequency of Communication with Friends and Family: More than three times a week    Frequency of Social Gatherings with Friends and Family: Once a week    Attends Religious Services: More than 4 times per year    Active Member of [employer] or Organizations: No    Attends Banker Meetings: Never    Marital Status: Married  Catering manager Violence: Not on  file    Review of Systems  All other systems reviewed and are negative.       Objective    BP 122/77   Pulse 85   Ht 5' 3 (1.6 m)   Wt 190 lb 9.6 oz (86.5 kg)   LMP 07/03/2024 (Approximate)   SpO2 96%   BMI 33.76 kg/m   Physical Exam Vitals and nursing note reviewed.  Constitutional:      General: She is not in acute distress.    Appearance: She is obese.  HENT:     Head: Normocephalic and atraumatic.     Right Ear: Tympanic membrane, ear canal and external ear normal.     Left Ear: Tympanic membrane, ear canal and external ear normal.     Nose: Nose normal.     Mouth/Throat:     Mouth: Mucous membranes are moist.     Pharynx: Oropharynx is clear.   Eyes:     Conjunctiva/sclera: Conjunctivae normal.     Pupils: Pupils are equal, round, and reactive to light.  Neck:     Thyroid : No thyromegaly.  Cardiovascular:     Rate and Rhythm: Normal rate and regular rhythm.     Heart sounds: Normal heart sounds. No murmur heard. Pulmonary:     Effort: Pulmonary effort is normal. No respiratory distress.     Breath sounds: Normal breath sounds.  Abdominal:     General: There is no distension.     Palpations: Abdomen is soft. There is no mass.     Tenderness: There is no abdominal tenderness.  Musculoskeletal:        General: Normal range of motion.     Cervical back: Normal range of motion and neck supple.  Skin:    General: Skin is warm and dry.  Neurological:     General: No focal deficit present.     Mental Status: She is alert and oriented to person, place, and time.  Psychiatric:        Mood and Affect: Mood normal.        Behavior: Behavior normal.         Assessment & Plan:   Annual physical exam -     CMP14+EGFR  Screening for deficiency anemia -     CBC with Differential/Platelet  Screening for lipid disorders -     Lipid panel  Screening for endocrine/metabolic/immunity disorders -     VITAMIN D  25 Hydroxy (Vit-D Deficiency, Fractures) -     Hemoglobin A1c -     TSH  Other orders -     Albuterol  Sulfate HFA; Inhale 2 puffs into the lungs every 4 (four) hours as needed.  Dispense: 6.7 each; Refill: 5 -     Budesonide -Formoterol  Fumarate; TAKE 1 PUFF BY MOUTH EVERY DAY  Dispense: 10.2 g; Refill: 11     Return in about 1 year (around 08/09/2025) for physical.   Tanda Raguel SQUIBB, MD

## 2024-08-10 ENCOUNTER — Ambulatory Visit: Payer: Self-pay | Admitting: Family Medicine

## 2024-08-10 LAB — LIPID PANEL
Chol/HDL Ratio: 3.2 ratio (ref 0.0–4.4)
Cholesterol, Total: 170 mg/dL (ref 100–199)
HDL: 53 mg/dL (ref >39)
LDL Chol Calc (NIH): 103 mg/dL — ABNORMAL HIGH (ref 0–99)
Triglycerides: 75 mg/dL (ref 0–149)
VLDL Cholesterol Cal: 14 mg/dL (ref 5–40)

## 2024-08-10 LAB — CMP14+EGFR
ALT: 17 IU/L (ref 0–32)
AST: 20 IU/L (ref 0–40)
Albumin: 4.1 g/dL (ref 3.9–4.9)
Alkaline Phosphatase: 53 IU/L (ref 44–121)
BUN/Creatinine Ratio: 12 (ref 9–23)
BUN: 12 mg/dL (ref 6–24)
Bilirubin Total: 0.4 mg/dL (ref 0.0–1.2)
CO2: 21 mmol/L (ref 20–29)
Calcium: 9.4 mg/dL (ref 8.7–10.2)
Chloride: 103 mmol/L (ref 96–106)
Creatinine, Ser: 1.01 mg/dL — ABNORMAL HIGH (ref 0.57–1.00)
Globulin, Total: 3.2 g/dL (ref 1.5–4.5)
Glucose: 78 mg/dL (ref 70–99)
Potassium: 4.3 mmol/L (ref 3.5–5.2)
Sodium: 139 mmol/L (ref 134–144)
Total Protein: 7.3 g/dL (ref 6.0–8.5)
eGFR: 71 mL/min/1.73 (ref >59)

## 2024-08-10 LAB — HEMOGLOBIN A1C
Est. average glucose Bld gHb Est-mCnc: 117 mg/dL
Hgb A1c MFr Bld: 5.7 % — ABNORMAL HIGH (ref 4.8–5.6)

## 2024-08-10 LAB — VITAMIN D 25 HYDROXY (VIT D DEFICIENCY, FRACTURES): Vit D, 25-Hydroxy: 48.6 ng/mL (ref 30.0–100.0)

## 2024-09-10 ENCOUNTER — Ambulatory Visit: Admitting: Family Medicine

## 2025-08-13 ENCOUNTER — Encounter: Admitting: Family Medicine
